# Patient Record
Sex: Female | Born: 1955 | Race: White | Hispanic: No | Marital: Married | Smoking: Former smoker
Health system: Southern US, Community
[De-identification: ages and names within clinical notes are randomized; demographics above are authoritative.]

## PROBLEM LIST (undated history)

## (undated) DIAGNOSIS — Z973 Presence of spectacles and contact lenses: Secondary | ICD-10-CM

## (undated) DIAGNOSIS — E119 Type 2 diabetes mellitus without complications: Secondary | ICD-10-CM

## (undated) DIAGNOSIS — E785 Hyperlipidemia, unspecified: Secondary | ICD-10-CM

## (undated) DIAGNOSIS — K219 Gastro-esophageal reflux disease without esophagitis: Secondary | ICD-10-CM

## (undated) DIAGNOSIS — E890 Postprocedural hypothyroidism: Secondary | ICD-10-CM

## (undated) DIAGNOSIS — F411 Generalized anxiety disorder: Secondary | ICD-10-CM

## (undated) DIAGNOSIS — Z8639 Personal history of other endocrine, nutritional and metabolic disease: Secondary | ICD-10-CM

## (undated) DIAGNOSIS — M75102 Unspecified rotator cuff tear or rupture of left shoulder, not specified as traumatic: Secondary | ICD-10-CM

## (undated) DIAGNOSIS — J209 Acute bronchitis, unspecified: Secondary | ICD-10-CM

## (undated) DIAGNOSIS — F32A Depression, unspecified: Secondary | ICD-10-CM

## (undated) DIAGNOSIS — M1990 Unspecified osteoarthritis, unspecified site: Secondary | ICD-10-CM

## (undated) DIAGNOSIS — I10 Essential (primary) hypertension: Secondary | ICD-10-CM

## (undated) DIAGNOSIS — F329 Major depressive disorder, single episode, unspecified: Secondary | ICD-10-CM

## (undated) HISTORY — DX: Hyperlipidemia, unspecified: E78.5

## (undated) HISTORY — DX: Unspecified osteoarthritis, unspecified site: M19.90

## (undated) HISTORY — PX: TOTAL HIP ARTHROPLASTY: SHX124

## (undated) HISTORY — PX: JOINT REPLACEMENT: SHX530

## (undated) HISTORY — DX: Essential (primary) hypertension: I10

---
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
## 1994-02-21 HISTORY — PX: TUBAL LIGATION: SHX77

## 1997-08-01 ENCOUNTER — Ambulatory Visit (HOSPITAL_COMMUNITY): Admission: RE | Admit: 1997-08-01 | Discharge: 1997-08-01 | Payer: Self-pay | Admitting: Obstetrics and Gynecology

## 1998-08-04 ENCOUNTER — Encounter: Payer: Self-pay | Admitting: Obstetrics and Gynecology

## 1998-08-04 ENCOUNTER — Ambulatory Visit (HOSPITAL_COMMUNITY): Admission: RE | Admit: 1998-08-04 | Discharge: 1998-08-04 | Payer: Self-pay | Admitting: Obstetrics and Gynecology

## 1998-08-12 ENCOUNTER — Other Ambulatory Visit: Admission: RE | Admit: 1998-08-12 | Discharge: 1998-08-12 | Payer: Self-pay | Admitting: Obstetrics and Gynecology

## 1999-08-06 ENCOUNTER — Encounter: Payer: Self-pay | Admitting: Internal Medicine

## 1999-08-06 ENCOUNTER — Ambulatory Visit (HOSPITAL_COMMUNITY): Admission: RE | Admit: 1999-08-06 | Discharge: 1999-08-06 | Payer: Self-pay | Admitting: Internal Medicine

## 1999-08-13 ENCOUNTER — Other Ambulatory Visit: Admission: RE | Admit: 1999-08-13 | Discharge: 1999-08-13 | Payer: Self-pay | Admitting: Obstetrics and Gynecology

## 2000-08-07 ENCOUNTER — Encounter: Payer: Self-pay | Admitting: Obstetrics and Gynecology

## 2000-08-07 ENCOUNTER — Ambulatory Visit (HOSPITAL_COMMUNITY): Admission: RE | Admit: 2000-08-07 | Discharge: 2000-08-07 | Payer: Self-pay | Admitting: Obstetrics and Gynecology

## 2000-08-22 ENCOUNTER — Other Ambulatory Visit: Admission: RE | Admit: 2000-08-22 | Discharge: 2000-08-22 | Payer: Self-pay | Admitting: Obstetrics and Gynecology

## 2000-12-22 ENCOUNTER — Encounter: Payer: Self-pay | Admitting: Orthopedic Surgery

## 2000-12-22 ENCOUNTER — Encounter: Admission: RE | Admit: 2000-12-22 | Discharge: 2000-12-22 | Payer: Self-pay | Admitting: Orthopedic Surgery

## 2001-06-21 ENCOUNTER — Encounter: Admission: RE | Admit: 2001-06-21 | Discharge: 2001-06-21 | Payer: Self-pay | Admitting: Orthopedic Surgery

## 2001-06-21 ENCOUNTER — Encounter: Payer: Self-pay | Admitting: Orthopedic Surgery

## 2001-08-09 ENCOUNTER — Ambulatory Visit (HOSPITAL_COMMUNITY): Admission: RE | Admit: 2001-08-09 | Discharge: 2001-08-09 | Payer: Self-pay | Admitting: Obstetrics and Gynecology

## 2001-08-09 ENCOUNTER — Encounter: Payer: Self-pay | Admitting: Obstetrics and Gynecology

## 2001-08-20 ENCOUNTER — Other Ambulatory Visit: Admission: RE | Admit: 2001-08-20 | Discharge: 2001-08-20 | Payer: Self-pay | Admitting: Obstetrics and Gynecology

## 2001-10-18 ENCOUNTER — Emergency Department (HOSPITAL_COMMUNITY): Admission: EM | Admit: 2001-10-18 | Discharge: 2001-10-18 | Payer: Self-pay | Admitting: Emergency Medicine

## 2002-08-23 ENCOUNTER — Ambulatory Visit (HOSPITAL_COMMUNITY): Admission: RE | Admit: 2002-08-23 | Discharge: 2002-08-23 | Payer: Self-pay | Admitting: Obstetrics and Gynecology

## 2002-08-23 ENCOUNTER — Encounter: Payer: Self-pay | Admitting: Obstetrics and Gynecology

## 2002-09-06 ENCOUNTER — Ambulatory Visit (HOSPITAL_BASED_OUTPATIENT_CLINIC_OR_DEPARTMENT_OTHER): Admission: RE | Admit: 2002-09-06 | Discharge: 2002-09-06 | Payer: Self-pay | Admitting: *Deleted

## 2003-01-08 ENCOUNTER — Other Ambulatory Visit: Admission: RE | Admit: 2003-01-08 | Discharge: 2003-01-08 | Payer: Self-pay | Admitting: Obstetrics and Gynecology

## 2003-06-16 ENCOUNTER — Inpatient Hospital Stay (HOSPITAL_COMMUNITY): Admission: RE | Admit: 2003-06-16 | Discharge: 2003-06-20 | Payer: Self-pay | Admitting: Orthopedic Surgery

## 2003-09-01 ENCOUNTER — Ambulatory Visit (HOSPITAL_COMMUNITY): Admission: RE | Admit: 2003-09-01 | Discharge: 2003-09-01 | Payer: Self-pay | Admitting: Obstetrics and Gynecology

## 2004-04-23 ENCOUNTER — Other Ambulatory Visit: Admission: RE | Admit: 2004-04-23 | Discharge: 2004-04-23 | Payer: Self-pay | Admitting: Obstetrics and Gynecology

## 2004-10-07 ENCOUNTER — Ambulatory Visit (HOSPITAL_COMMUNITY): Admission: RE | Admit: 2004-10-07 | Discharge: 2004-10-07 | Payer: Self-pay | Admitting: Obstetrics and Gynecology

## 2005-02-21 HISTORY — PX: THYROIDECTOMY: SHX17

## 2005-06-09 ENCOUNTER — Other Ambulatory Visit: Admission: RE | Admit: 2005-06-09 | Discharge: 2005-06-09 | Payer: Self-pay | Admitting: Interventional Radiology

## 2005-06-09 ENCOUNTER — Encounter: Admission: RE | Admit: 2005-06-09 | Discharge: 2005-06-09 | Payer: Self-pay | Admitting: General Surgery

## 2005-06-09 ENCOUNTER — Encounter (INDEPENDENT_AMBULATORY_CARE_PROVIDER_SITE_OTHER): Payer: Self-pay | Admitting: Specialist

## 2005-08-19 ENCOUNTER — Ambulatory Visit (HOSPITAL_COMMUNITY): Admission: RE | Admit: 2005-08-19 | Discharge: 2005-08-20 | Payer: Self-pay | Admitting: General Surgery

## 2005-08-19 ENCOUNTER — Encounter (INDEPENDENT_AMBULATORY_CARE_PROVIDER_SITE_OTHER): Payer: Self-pay | Admitting: Specialist

## 2005-10-26 ENCOUNTER — Ambulatory Visit (HOSPITAL_COMMUNITY): Admission: RE | Admit: 2005-10-26 | Discharge: 2005-10-26 | Payer: Self-pay | Admitting: Obstetrics and Gynecology

## 2005-11-28 ENCOUNTER — Ambulatory Visit (HOSPITAL_COMMUNITY): Admission: RE | Admit: 2005-11-28 | Discharge: 2005-11-28 | Payer: Self-pay | Admitting: Occupational Medicine

## 2006-01-13 ENCOUNTER — Encounter: Admission: RE | Admit: 2006-01-13 | Discharge: 2006-01-13 | Payer: Self-pay | Admitting: Orthopedic Surgery

## 2006-08-21 ENCOUNTER — Inpatient Hospital Stay (HOSPITAL_COMMUNITY): Admission: RE | Admit: 2006-08-21 | Discharge: 2006-08-24 | Payer: Self-pay | Admitting: Orthopedic Surgery

## 2006-09-21 ENCOUNTER — Ambulatory Visit (HOSPITAL_BASED_OUTPATIENT_CLINIC_OR_DEPARTMENT_OTHER): Admission: RE | Admit: 2006-09-21 | Discharge: 2006-09-21 | Payer: Self-pay | Admitting: Specialist

## 2006-09-21 HISTORY — PX: OTHER SURGICAL HISTORY: SHX169

## 2006-10-03 ENCOUNTER — Encounter: Admission: RE | Admit: 2006-10-03 | Discharge: 2007-01-01 | Payer: Self-pay | Admitting: Specialist

## 2007-01-10 ENCOUNTER — Encounter: Admission: RE | Admit: 2007-01-10 | Discharge: 2007-01-17 | Payer: Self-pay | Admitting: Specialist

## 2007-03-28 ENCOUNTER — Ambulatory Visit (HOSPITAL_COMMUNITY): Admission: RE | Admit: 2007-03-28 | Discharge: 2007-03-28 | Payer: Self-pay | Admitting: Obstetrics and Gynecology

## 2007-09-05 ENCOUNTER — Encounter: Admission: RE | Admit: 2007-09-05 | Discharge: 2007-11-13 | Payer: Self-pay | Admitting: Endocrinology

## 2007-10-16 ENCOUNTER — Ambulatory Visit (HOSPITAL_COMMUNITY): Admission: RE | Admit: 2007-10-16 | Discharge: 2007-10-16 | Payer: Self-pay | Admitting: Gastroenterology

## 2007-10-16 ENCOUNTER — Encounter (INDEPENDENT_AMBULATORY_CARE_PROVIDER_SITE_OTHER): Payer: Self-pay | Admitting: Gastroenterology

## 2008-05-05 ENCOUNTER — Ambulatory Visit (HOSPITAL_COMMUNITY): Admission: RE | Admit: 2008-05-05 | Discharge: 2008-05-05 | Payer: Self-pay | Admitting: Obstetrics and Gynecology

## 2008-06-11 ENCOUNTER — Ambulatory Visit (HOSPITAL_COMMUNITY): Admission: RE | Admit: 2008-06-11 | Discharge: 2008-06-11 | Payer: Self-pay | Admitting: Obstetrics and Gynecology

## 2008-06-16 ENCOUNTER — Ambulatory Visit (HOSPITAL_COMMUNITY): Admission: RE | Admit: 2008-06-16 | Discharge: 2008-06-16 | Payer: Self-pay | Admitting: Family Medicine

## 2009-07-02 ENCOUNTER — Ambulatory Visit (HOSPITAL_COMMUNITY): Admission: RE | Admit: 2009-07-02 | Discharge: 2009-07-02 | Payer: Self-pay | Admitting: Obstetrics and Gynecology

## 2009-08-14 ENCOUNTER — Emergency Department (HOSPITAL_COMMUNITY): Admission: EM | Admit: 2009-08-14 | Discharge: 2009-08-14 | Payer: Self-pay | Admitting: Emergency Medicine

## 2009-10-21 ENCOUNTER — Ambulatory Visit (HOSPITAL_COMMUNITY)
Admission: RE | Admit: 2009-10-21 | Discharge: 2009-10-21 | Payer: Self-pay | Source: Home / Self Care | Admitting: Internal Medicine

## 2010-03-14 ENCOUNTER — Encounter: Payer: Self-pay | Admitting: Family Medicine

## 2010-04-27 IMAGING — MG MM DIGITAL SCREENING BILAT
4 series · 4 of 4 positions shown · non-contrast
Comparison: Prior studies.

DG SCREEN MAMMOGRAM BILATERAL
Bilateral CC and MLO view(s) were taken.
Technologist: Nehman Othman, RT, RM

DIGITAL SCREENING MAMMOGRAM WITH CAD:

[R CC]
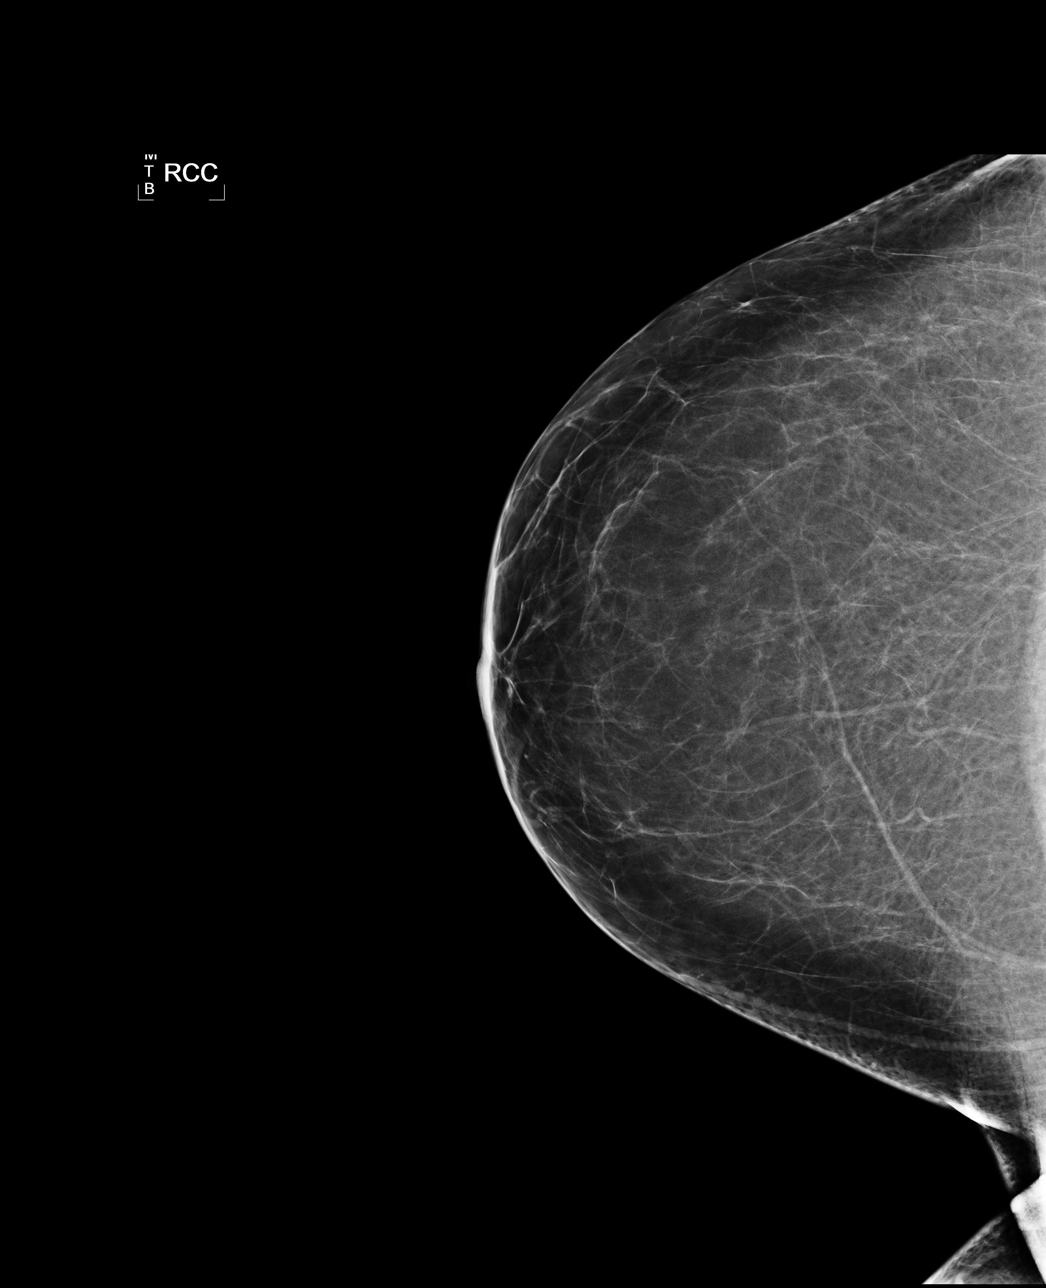

[R MLO]
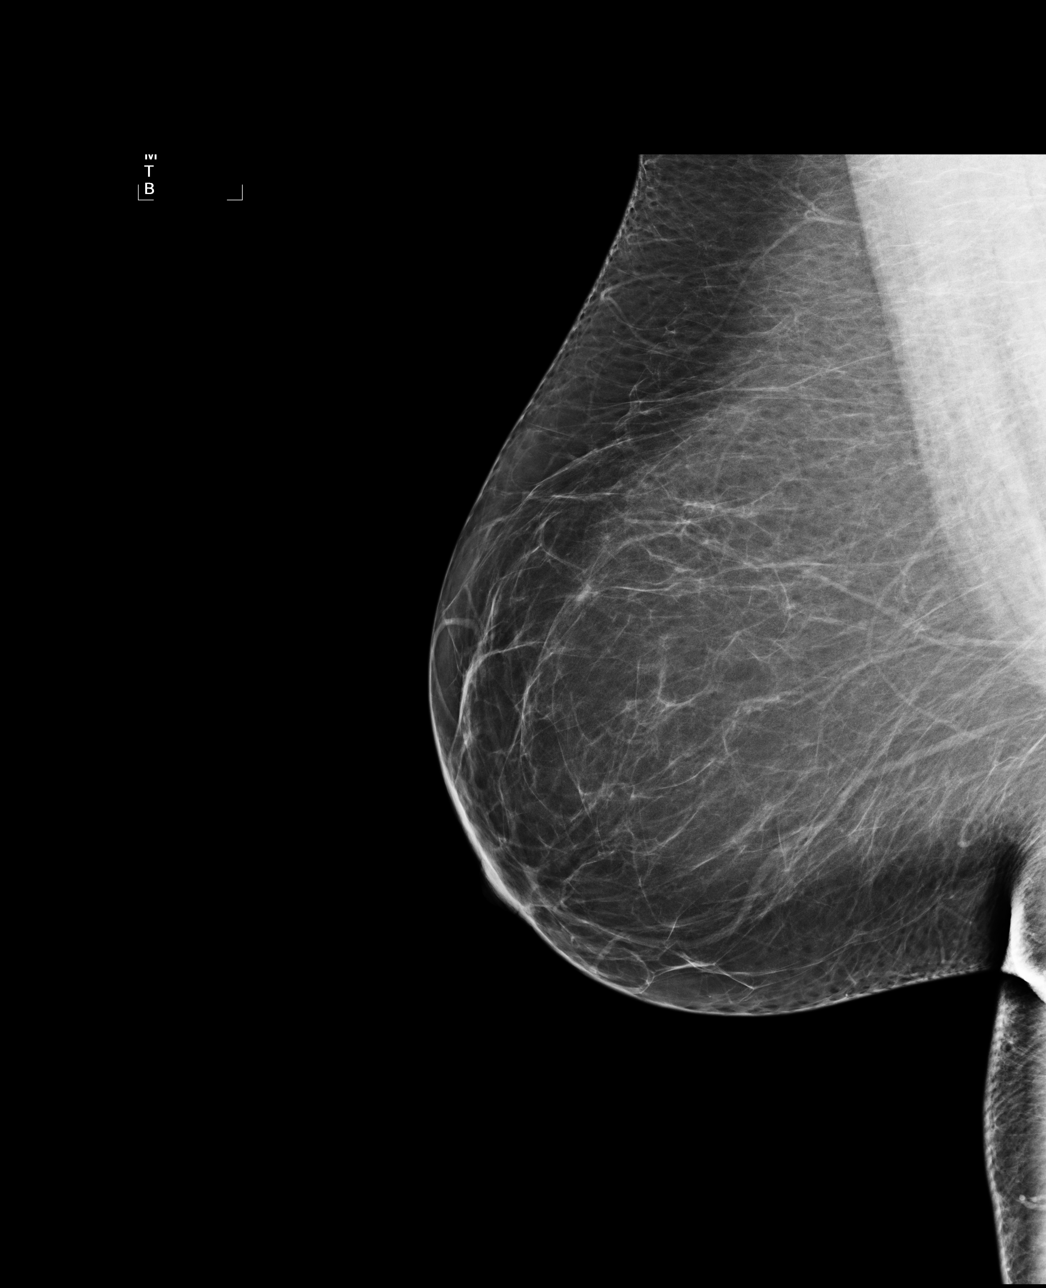

[L CC]
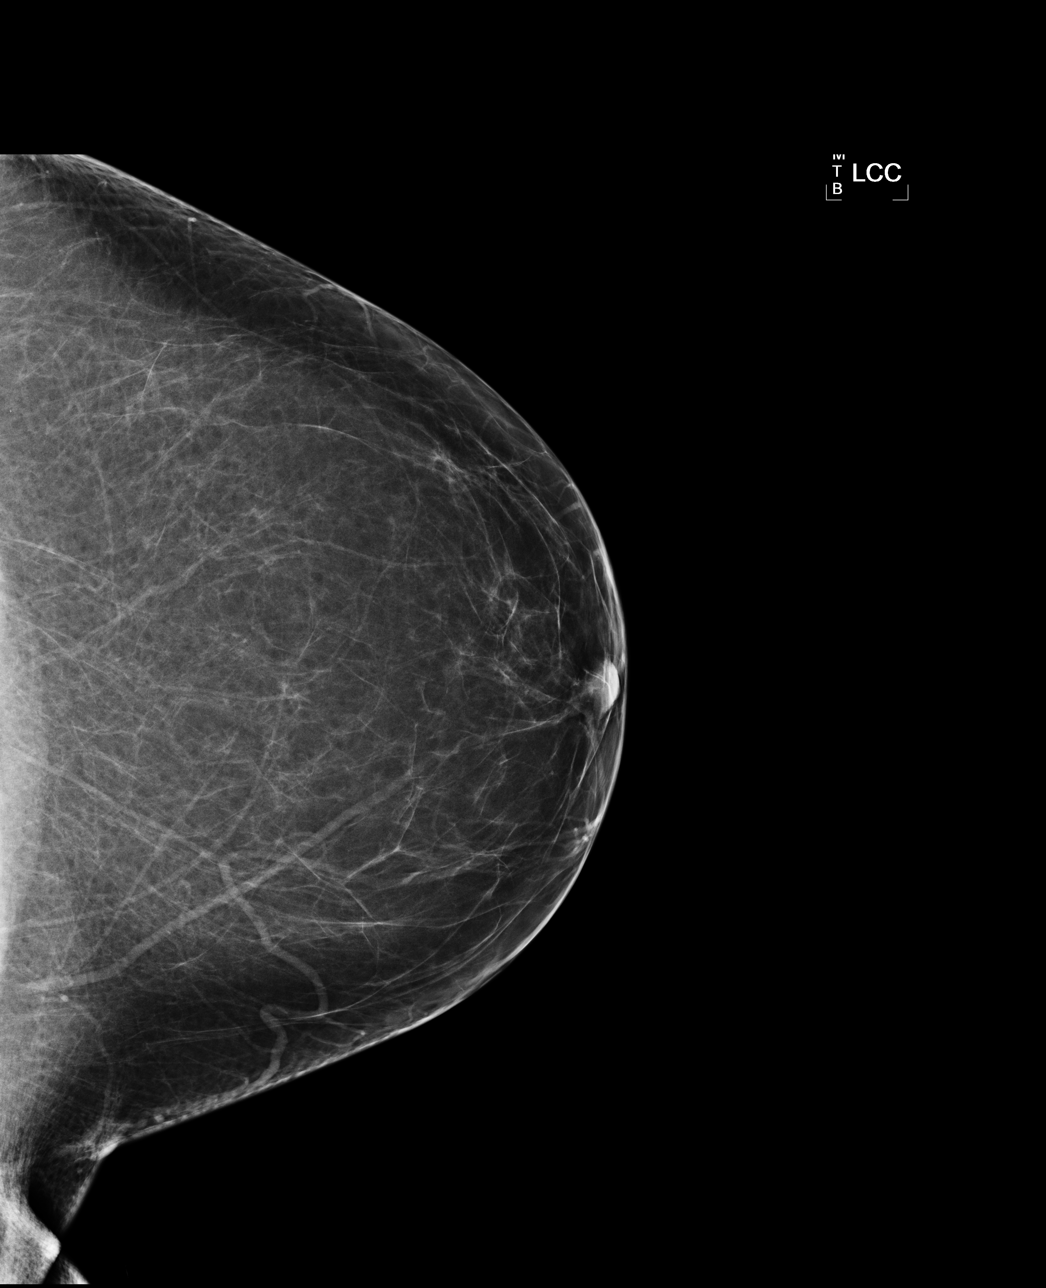

[L MLO]
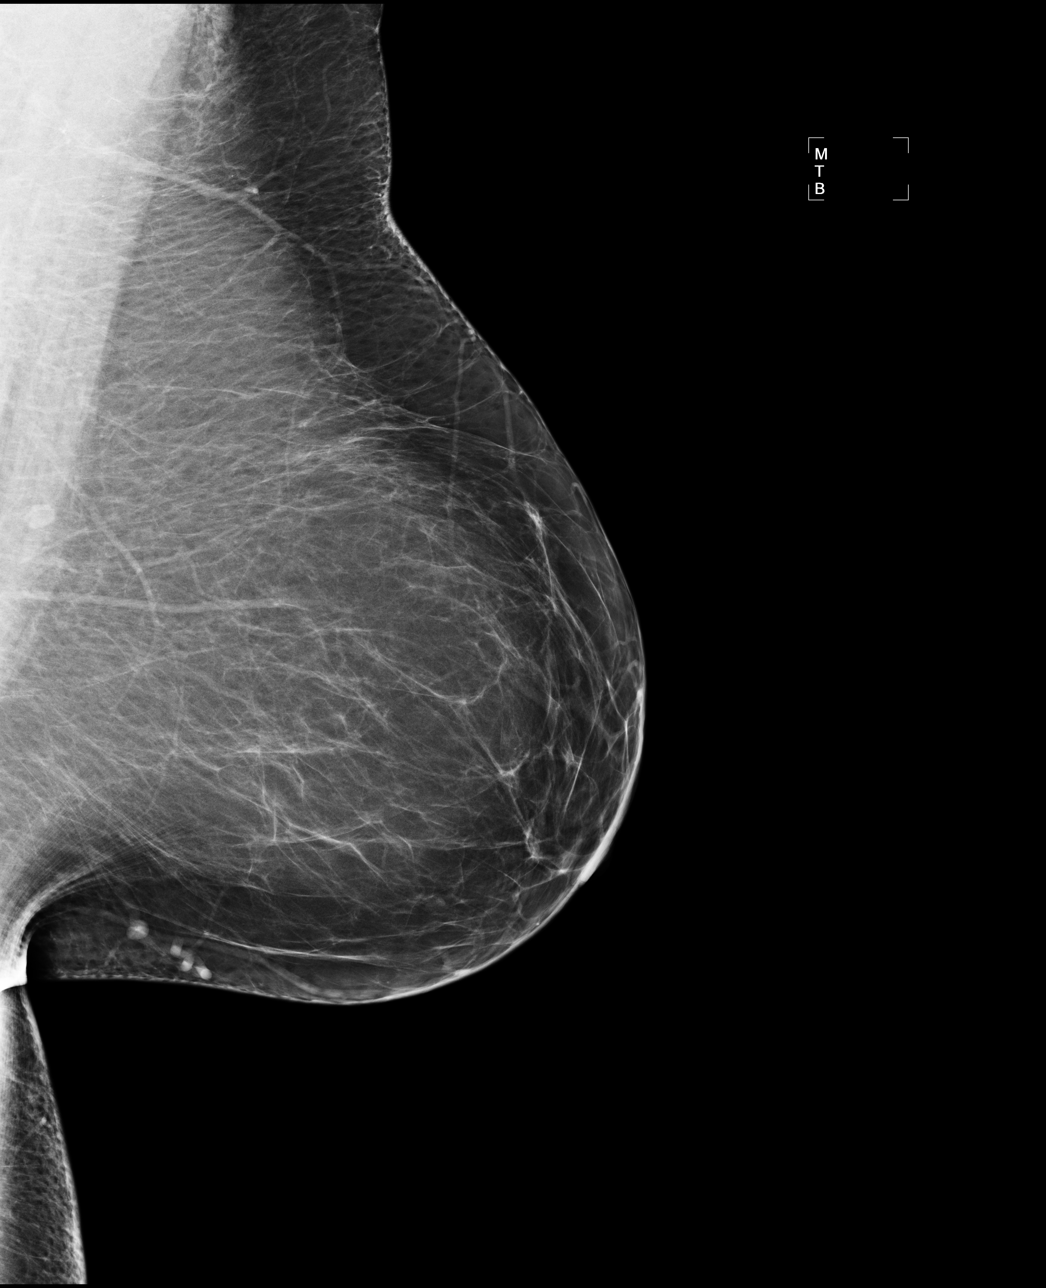

[4 of 4 positions shown; findings below may reference images not displayed]

The breast tissue is almost entirely fatty.  There is no dominant mass, architectural distortion or
calcification to suggest malignancy.
IMPRESSION: No mammographic evidence of malignancy.  Suggest yearly screening mammography.

A result letter of this screening mammogram will be mailed directly to the patient.

ASSESSMENT: Negative - BI-RADS 1

Screening mammogram in 1 year.
ANALYZED BY COMPUTER AIDED DETECTION. , THIS PROCEDURE WAS A DIGITAL MAMMOGRAM.

## 2010-07-06 NOTE — Op Note (Signed)
NAMERollene Howell              ACCOUNT NO.:  [id number]   MEDICAL RECORD NO.:  [id number]          PATIENT TYPE:  INP   LOCATION:  F621                         FACILITY:  [facility]   PHYSICIAN:  Ollen Gross, M.D.    DATE OF BIRTH:  1955-10-23   DATE OF PROCEDURE:  08/21/2006  DATE OF DISCHARGE:                               OPERATIVE REPORT   PREOPERATIVE DIAGNOSIS:  Osteoarthritis and dysplasia right hip.   POSTOPERATIVE DIAGNOSIS:  Osteoarthritis and dysplasia right hip.   PROCEDURE:  Right total hip arthroplasty.   SURGEON:  Ollen Gross, M.D.   ASSISTANT:  Avel Peace, PA-C.   ANESTHESIA:  General.   ESTIMATED BLOOD LOSS:  600.   DRAINS:  None.   COMPLICATIONS:  None.   CONDITION:  Stable to recovery.   BRIEF CLINICAL NOTE:  Cathy Howell is a 55 year old female who had a  successful left total hip arthroplasty done in April 2005.  She has  progressively worsening right hip pain and dysfunction with bone-on-bone  changes.  Presents now for right total hip arthroplasty.   PROCEDURE IN DETAIL:  After the successful administration of general  anesthetic, the patient is placed in the left lateral decubitus position  with the right side up and held with the hip positioner.  Right lower  extremity was isolated from her perineum with plastic drapes and prepped  and draped in the usual sterile fashion.  Short posterior and lateral  incisions made with a 10 blade through the subcutaneous tissue to the  level of the fascia lata was is incised in line with the skin incision.  Sciatic nerve is palpated and protected and short rotator is isolated  off the femur.  Capsulectomy is performed and the hip is dislocated.  Center of femoral head is marked and a trial prosthesis placed such that  the center of the trial head corresponds to the center of her native  femoral head.  Osteotomy lines marked on the femoral neck and osteotomy  made with an oscillating saw.  Femoral head is  removed and the femur  retracted anteriorly to gain acetabular exposure.   Acetabular retractors are placed.  She had a large osteophytes anterior-  posterior which are removed.  Reaming starts at 43 mm coursing  increments of 2-49 mm and a 50 mm pinnacle acetabular shell is placed in  anatomic position with excellent purchase and transfixed with two dome  screws.  The apex hole eliminator is placed and then the 36 mm neutral  Ultamet metal liner is placed.   Femur is repaired with the canal finder and irrigation.  Axial reaming  is performed up to 11.5 mm, proximal reaming to a 94F and the sleeve  machined to a small.  A 94F small trial sleeve is placed with a 16 x 11  stem and a 36 +6 neck about 10 degrees beyond her native anteversion.  A  36 +0 head is placed and the hip is reduced with outstanding stability.  There is full extension, full external rotation, 70 degrees flexion, 40  degrees adduction, 90 degrees internal rotation and 90 degrees  of  flexion and 70 degrees of internal rotation.  By placing the right leg  on top of the left, I felt as though the leg lengths are equal.   The hip is dislocated and trials removed.  Permanent 76F small sleeve is  placed with a 16 x 11 stem and 36  +6 neck 10 degrees beyond native  anteversion.  The 36 +0 head is placed.  The hip is reduced in the same  stability parameters.  Wound is copiously irrigated with saline solution  and short rotator is reattached to the femur through drill holes.  Fascia lata is closed with interrupted #1 Vicryl, subcu closed with #1  and 2-0 Vicryl, subcuticular with running 4-0 Monocryl.  The incision is  cleaned and dried and Steri-Strips and a bulky sterile dressing applied.  She is then awakened and transported to recovery in stable condition.      Ollen Gross, M.D.  Electronically Signed     FA/MEDQ  D:  08/21/2006  T:  08/21/2006  Job:  [id number]

## 2010-07-06 NOTE — Op Note (Signed)
NAMERollene Howell              ACCOUNT NO.:  [id number]   MEDICAL RECORD NO.:  [id number]          PATIENT TYPE:  AMB   LOCATION:  DSC                          FACILITY:  [facility]   PHYSICIAN:  Erasmo Leventhal, M.D.DATE OF BIRTH:  14-Oct-1955   DATE OF PROCEDURE:  09/21/2006  DATE OF DISCHARGE:                               OPERATIVE REPORT   PREOPERATIVE DIAGNOSES:  Left shoulder impingement, rotator cuff tear,  acromioclavicular arthritis.   POSTOPERATIVE DIAGNOSES:  Left shoulder impingement, rotator cuff tear,  acromioclavicular arthritis.   PROCEDURE:  Left shoulder examination under anesthesia, arthroscopic  subacromial decompression, acromioplasty, bursectomy, CA ligament  release, arthroscopic disc clavicle resection Mumford procedure,  arthroscopic rotator cuff repair.   SURGEON:  Erasmo Leventhal, MD   ASSISTANT:  Vertell Novak, PA-C   ANESTHESIA:  Interscalene block, general.   ESTIMATED BLOOD LOSS:  Less than 10 mL.   DRAINS:  None.   COMPLICATIONS:  None.   DISPOSITION:  PACU, stable.   OPERATIVE DETAILS:  The patient and family was counseled in the holding  area.  Correct site was identified.  IV started.  Antibiotics given.  Block was administered.  Cathy Howell was then taken to the operating room,  placed in supine position under general anesthesia.  Cathy Howell had undergone a  right total hip arthroplasty relatively recently.  Cathy Howell was cleared by  her hip surgeon to have arthroscopic surgery on her shoulder.  Cathy Howell was  gently turned to the right lateral decubitus position, properly padded,  and bumped.  Great care was given to the right lower extremity.  I made  sure it did not bother her wound nor her hip replacement.  Left shoulder  was examined and Cathy Howell was found to be full range of motion and stable.  Cathy Howell was appropriately prepped and draped in a sterile fashion.  Overhead  shoulder position utilized at 30-degrees abduction, 10-degrees full  flexion with  10 pounds of longitudinal traction. Posterior portal was  created and arthroscope was placed in the glenohumeral joint.  Diagnostic arthroscopy revealed normal articular cartilage, labrum,  biceps, glenohumeral ligament, synovium, and capsule.  Cathy Howell had a small  rent in the leading edge of the supraspinatus without any significant  retraction.  Arthroscope was placed in the subacromial region where a  thick subacromial bursa was encountered.  Lateral port was established  and subacromial bursectomy was performed.  Cathy Howell was found to have a 1.5-  cm rotator cuff tear at the leading edge of the supraspinatus without  significant retraction.  It was gently mobilized at rotator cuff repair  site.  The rotator cuff footprint was repaired appropriately.  The  ArthroCare system was utilized to release the periosteum of the CA  ligament.  Bur was then placed posteriorly and an anterior inferior  acromioplasty was performed converting to a flat acromion morphology.  AC joint was found to be remarkably osteoarthritic with underlying  subacromial subclavicular spur.  Accessory anterior portal was made, bur  was then placed, and a lateral 5-8 mm of the clavicle was removed  circumferentially leaving a superior and posterior acromioclavicular  limbus and capsule intact.  Arthroscopic debris was removed.  Hemostasis  obtained.  The clavicle was palpated and found to be stable.  I did do a  different portal site to a different small puncture area and an Arthrex  Bioabsorbable anchor was placed at deadman's angle.  It was  appropriately locked into placed.  Utilizing arthroscopic technique  mattress sutures were placed and tied down sequentially.  These were  then placed into a PushLock anchor for a double row technique.  We now  had a well-mobilized repaired rotator cuff and had reconstructed the  musculotendinous insertion site and reestablished the anterior anchor.  It was irrigated and arthroscopic  debris was removed and taken out of  traction.  Normal pulses in the wrist at the end of the case.  The  portals closed with nylon suture.  Another 10 mL of 0.25% Marcaine with  epinephrine was placed in the portal sites at subacromial regions.  After confirming the anesthesia, sterile dressings applied.  Cathy Howell was  returned supine and placed in a small shoulder sling and awakened,  extubated, and taken to operating room PACU in stable condition.  The  patient tolerated the procedure well.  There were no complications or  problems.  We also checked her hip and lower extremities and they were  fine at the end of the case.  The patient left the operating room in  stable condition.  Sponge and needle count were correct.   Help with surgical technique: Clover Mealy, PA-C, assistance was  needed throughout the entire case.           ______________________________  Erasmo Leventhal, M.D.     RAC/MEDQ  D:  09/21/2006  T:  09/21/2006  Job:  [id number]

## 2010-07-06 NOTE — Op Note (Signed)
NAMERollene Howell              ACCOUNT NO.:  [id number]   MEDICAL RECORD NO.:  [id number]          PATIENT TYPE:  AMB   LOCATION:  ENDO                         FACILITY:  [facility]   PHYSICIAN:  Petra Kuba, M.D.    DATE OF BIRTH:  [birth year]   DATE OF PROCEDURE:  10/16/2007  DATE OF DISCHARGE:                               OPERATIVE REPORT   PROCEDURE:  Colonoscopy with biopsy.   INDICATIONS:  Patient with mom with questionable polyps. Her dad, i.e.  her grandfather, with colon cancer. Due for colonic screening. Consent  was signed after risks, benefits, methods, and options thoroughly  discussed prior to any sedation in my office with my nurse.   MEDICINES USED:  Fentanyl 100 mcg, Versed 8 mg.   PROCEDURE NOTE:  Rectal inspection was pertinent for external  hemorrhoids, small. Digital exam was negative. The video colonoscope was  inserted and with abdominal pressure easily advanced around the colon to  the cecum. On insertion, some left-sided diverticula were seen but no  other abnormalities. The cecum was identified by the appendiceal orifice  and ileocecal valve. Prep was adequate. There was some liquid stool that  required washing and suctioning. On slow withdrawal through the colon,  the cecum, ascending and majority of the transverse was normal. Near the  proximal level of the splenic flexure, a tiny polyp was seen and cold  biopsied x4. The scope was slowly withdrawn. The left-sided diverticula  were confirmed. Once back in the rectum, anorectal pull through and  retroflexion confirmed some small hemorrhoids. The scope was  straightened and readvanced a short way up the left side of the colon.  Air was suctioned. Scope removed. The patient tolerated the procedure  well. There were no obvious immediate complications.   ENDOSCOPIC DIAGNOSES:  1. Internal and external small hemorrhoids.  2. Left-sided diverticula.  3. Proximal level splenic flexure tiny poly, cold  biopsied.  4. Otherwise within normal limits to the cecum.   PLAN:  Await pathology. Probably repeat colon screening in 5 years.  Happy to see back p.r.n.           ______________________________  Petra Kuba, M.D.     MEM/MEDQ  D:  10/16/2007  T:  10/16/2007  Job:  [id number]   cc:   Petra Kuba, M.D.  Fax: [phone]   Dorisann Frames, M.D.

## 2010-07-06 NOTE — Discharge Summary (Signed)
NAMERollene Howell              ACCOUNT NO.:  [id number]   MEDICAL RECORD NO.:  [id number]          PATIENT TYPE:  INP   LOCATION:  1616                         FACILITY:  [facility]   PHYSICIAN:  Ollen Gross, M.D.    DATE OF BIRTH:  [birth year]   DATE OF ADMISSION:  08/21/2006  DATE OF DISCHARGE:  08/24/2006                               DISCHARGE SUMMARY   ADMISSION DIAGNOSES:  1. Dysplasia with end-stage osteoarthritis, right hip.  2. Hypertension.  3. Hypercholesterolemia.  4. 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  5. History of urinary tract infection.  6. Hypothyroidism.  7. Osteoporosis.  8. Postmenopausal.   DISCHARGE DIAGNOSES:  1. Osteoarthritis, right hip, with dysplasia, status post right total      hip arthroplasty.  2. Postoperative blood loss anemia.  3. Status post transfusion without sequelae.  4. Hypertension.  5. Hypercholesterolemia.  6. History of toxoplasmosis, age 47.  7. History of urinary tract infection.  8. Hypothyroidism.  9. Osteoporosis.  10.Postmenopausal.   PROCEDURE:  August 21, 2006, right total hip.  Surgeon:  Dr. Lequita Halt.  Assistant:  Avel Peace, PA-C.  Anesthesia:  General.   CONSULTATIONS:  None.   BRIEF HISTORY:  Cathy Howell is a 55 year old female with successful left  total hip done in April 2005, progressive worsening right hip pain and  dysfunction bone-on-bone, felt to be a good candidate for right total  hip.   LABORATORY DATA:  Admission CBC shows hemoglobin of 12.3, hematocrit of  35.2, white cell count of 6.2.  Postop hemoglobin of 9.0, drifted down  to 8.5, given blood.  Post-transfusion hemoglobin back up to 9.1.  PT/PTT on admission 13.4 and 28, respectively.  INR 1.0.  Serial pro  times followed, last noted INR 2.2.  Chem panel on admission all within  normal limits.  Serial BMETs were followed.  Electrolytes remained  within normal limits.  Preop UA negative.  Blood group type O+.   X-RAYS:  Two-view chest August 16, 2006:   Mild elevated right  hemidiaphragm without change.  No active disease.  Right hip film:  Status post left total hip, moderately advanced degenerative, right hip.   EKG August 16, 2006:  Normal sinus rhythm, early RS transition in V2.  No  significant change was found when compared to August 16, 2006.  Confirmed  by Dr. Eldridge Dace.   HOSPITAL COURSE:  The patient was admitted to [facility],  tolerated the procedure well.  Later transferred to the recovery  room  and orthopedic floor.  Started on PCA and p.o. analgesic pain control  following surgery.  Doing fairly well on the morning of day #1.  She was  doing better than with her previous hip.  She had moderate pain postop.  She was tolerating p.o.'s and using the PCA.  Hemoglobin was down little  bit so we started er on some iron.  She started getting out of bed.  Unfortunately, on the evening of day #1, morning of day #2, she had a  moderate increase in her pain requiring reinitiation of the PCA, which  had been discontinued on  day #1, and the covering service switched her  over from the Percocet over to the Dilaudid.  Her hemoglobin was down to  8.5 so we gave her one 1 unit of blood.  She had a little elevated  temperature so we encouraged incentive spirometer.  A little slow going  on day #2 on the morning; however, after she got her blood, she was  feeling a little bit better.  She did get up and walk about 200 feet  that evening.  We also started her on OxyContin, which helped out with  the pain control.  By day #3 on the morning we noticed that she had been  switched back over from the Dilaudid back over to Percocet.  She was  doing a little bit better with her of pain control.  Dressing changed on  day #2 and also again on day #3.  Incision was healing well.  She was  seen in rounds on the morning of day #3 by Dr. Lequita Halt.  They discussed  her therapy, her pain control, which was under better control, and she  wanted to go  home later that day.   DISCHARGE PLAN:  1. The patient was discharged home on August 24, 2006.  2. Discharge diagnoses:  Same as above.  3. Discharge medications:  Percocet, OxyContin, Robaxin, Coumadin, Nu-      Iron.  4. Diet:  Resume previous home diet.  5. Activity:  She is partial weightbearing, 25-50% in the right lower      extremity.  Hip precautions, total hip protocol.  Home health PT      and home health nursing.  6. Follow-up:  Two weeks from discharge.  Please contact the office at      [phone].   DISPOSITION:  Home.   CONDITION ON DISCHARGE:  Improving.      Alexzandrew L. Perkins, P.A.C.      Ollen Gross, M.D.  Electronically Signed    ALP/MEDQ  D:  08/24/2006  T:  08/24/2006  Job:  [id number]   cc:   Cathy Howell, M.D.  Fax: [phone]

## 2010-07-06 NOTE — H&P (Signed)
NAMERollene Howell              ACCOUNT NO.:  [id number]   MEDICAL RECORD NO.:  [id number]          PATIENT TYPE:  INP   LOCATION:  NA                           FACILITY:  [facility]   PHYSICIAN:  Ollen Gross, M.D.    DATE OF BIRTH:  August 04, 1955   DATE OF ADMISSION:  08/21/2006  DATE OF DISCHARGE:                              HISTORY & PHYSICAL   CHIEF COMPLAINT:  Right hip pain.   HISTORY OF PRESENT ILLNESS:  The patient is a 55 year old female who has  had ongoing right hip pain for quite some time now.  She has undergone a  left total hip back in 2005 and has done very well with that.  The right  hip has continued to be progressive in nature, and it is felt she has  reached a point where she could benefit undergoing the other side.  Risks and benefits discussed.  The patient was subsequently admitted to  the hospital.   ALLERGIES:  NO KNOWN DRUG ALLERGIES.   CURRENT MEDICATIONS:  1. Lisinopril/hydrochlorothiazide.  2. Levothyroxine.  3. Temazepam.  4. Trazodone.  5. Pravastatin.  6. Propoxyphene (Darvocet).   PAST MEDICAL HISTORY:  1. Hypertension.  2. Past history of toxoplasmosis back around age 12.  3. History of urinary tract infections.  4. Hypercholesterolemia.  5. Hypothyroidism.  6. Osteoporosis,.  7. Postmenopausal.   PAST SURGICAL HISTORY:  1. Left total hip replacement April 2005.  2. Thyroidectomy June 2007.   SOCIAL HISTORY:  Married.  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  [employer].  No alcohol.  Two children.  Husband, son and  friends will be assisting with care after surgery.   FAMILY HISTORY:  Mother with a history of diabetes.  Grandmother on  mother's side with history of stroke.   FAMILY HISTORY:  History of hypertension.   REVIEW OF SYSTEMS:  GENERAL:  No fevers, chills, night sweats.  NEUROLOGICAL:  No seizures or paralysis.  RESPIRATORY: No shortness of  breath, productive cough or hemoptysis.  CARDIOVASCULAR:  No Chest  pain  or orthopnea.  GI: No nausea, vomiting, diarrhea or constipation.  GU:  No dysuria, hematuria or discharge.  MUSCULOSKELETAL:  Right hip.   PHYSICAL EXAMINATION:  VITAL SIGNS:  Pulse 74, respirations 12, blood  pressure 110/68.  GENERAL: A 55 year old white female well-nourished, well-developed,  short stature, slightly overweight.  No acute distress, alert and  cooperative, pleasant.  HEENT: Normal normocephalic, atraumatic.  Pupils round and reactive.  No  glasses.  EOMs intact.  NECK:  Supple.  CHEST:  Clear.  HEART:  Regular rate and rhythm without murmur.  ABDOMEN:  Soft, slightly round.  Bowel sounds present.  RECTAL/BREASTS/GU:  Not done, not pertinent to present illness.  EXTREMITIES:  Right hip:  Right hip shows flexion of 100 degrees, zero  internal rotation, zero external rotation by 20 degrees abduction.   IMPRESSION:  1. Dysplasia with end-stage osteoarthritis right hip.  2. Hypertension.  3. Hypercholesterolemia.  4. History of toxoplasmosis age 23.  5. History of urinary tract infections.  6. Hypothyroidism.  7. Osteoporosis.  8. Postmenopausal.   PLAN:  The patient was admitted to [facility] to undergo a  right total hip replacement arthroplasty.  Surgery will be performed by  Dr. Ollen Gross.      Alexzandrew L. Perkins, P.A.C.      Ollen Gross, M.D.  Electronically Signed    ALP/MEDQ  D:  08/20/2006  T:  08/21/2006  Job:  [id number]   cc:   Teena Irani. Arlyce Dice, M.D.  Fax: [phone]   Ollen Gross, M.D.  Fax: [phone]

## 2010-07-09 NOTE — Consult Note (Signed)
NAMERollene Howell                          ACCOUNT NO.:  [id number]   MEDICAL RECORD NO.:  [id number]                   PATIENT TYPE:  INP   LOCATION:  0481                                 FACILITY:  [facility]   PHYSICIAN:  Melissa L. Ladona Ridgel, MD               DATE OF BIRTH:  Jul 22, 1955   DATE OF CONSULTATION:  06/18/2003  DATE OF DISCHARGE:                                   CONSULTATION   REASON FOR CONSULTATION:  Hypotension and hyperglycemia.   HISTORY OF PRESENT ILLNESS:  This is a 55 year old white female with past  medical history for severe left hip arthritis.  She is status post left hip  replacement.  Today she was noted to be postoperatively hypotensive and  hyperglycemia.  Blood pressure medications were held.   PAST MEDICAL HISTORY:  1. Hypertension.  2. Toxoplasmosis infection.   PAST SURGICAL HISTORY:  Tubal ligation and tonsils removed, I&D of leg  related to a spider bite.   SOCIAL HISTORY:  She does not smoke, does not drink.   FAMILY HISTORY:  Her Mom is living with hypertension and arthritis.  Dad is  deceased related to an unknown disease.   ALLERGIES:  None known.   HOME MEDICATIONS:  1. Vicon.  2. Lisinopril/hydrochlorothiazide 20/12.5 mg daily.   REVIEW OF SYSTEMS:  Positive hip pain.  Negative dizziness, negative chest  pain, negative shortness of breath, negative for dysuria.  Positive  menstrual cycle which started yesterday.   PHYSICAL EXAMINATION:  VITAL SIGNS:  Her temperature is 99.7 at present,  however her T-max was in the 100.1 range, pulse 119, blood pressure 86/42,  respirations are 18, saturation is 100% on 2 liters.  GENERAL:  She is in no acute distress at this time, however earlier she was  in mild to moderate distress secondary to pain.  HEENT:  She is normocephalic, atraumatic.  Pupils are equal, round and  reactive to light.  Extraocular movements are intact.  Mucous membranes are  moist.  NECK:  Supple, there is no JVD, no  lymph nodes.  CHEST:  Clear to auscultation, no rhonchi, rales or wheezes.  CARDIOVASCULAR:  Tachycardic with positive S1, S2, no S3 or S4.  ABDOMEN:  Soft, nontender, nondistended with positive bowel sounds.  EXTREMITIES:  Are warm with 2+ pulses and no edema.  NEUROLOGIC:  The patient is awake, alert and oriented x3.  Cranial nerves II-  XII  are intact.   LABORATORY DATA:  Reveal a hemoglobin of 9.1, hematocrit 26.3, which is a  decrease from her preadmission hematocrit of 36.  Her last basic metabolic  panel was on June 10, 2003 and was within normal limits.   ASSESSMENT AND PLAN:  This is a 55 year old white female status post left  hip replacement who was found to be hypotensive and hyperglycemic with some  tachycardia.   1. Hyperglycemia is likely related to stress and to D5  in her IV fluids.     Will change this to normal saline and follow her values.  2. Hypotension.  The patient is anemic and volume contracted, we will give     her normal saline at 100cc/Hr. and replete her with packed red blood     cells x2 and check a TSH.  3. Cardiovascular.  Holding her blood pressure medications at this time.     She has sinus tachycardia on her EKG with some new changes that are     likely related to lead placement and tachycardia but we will repeat an     EKG.  For now we will control her pain, replete her packed red blood     cells, and give her a fluid bolus.   We will follow along.  Thank you for this consultation.                                               Melissa L. Ladona Ridgel, MD    MLT/MEDQ  D:  06/18/2003  T:  06/18/2003  Job:  [id number]

## 2010-07-14 ENCOUNTER — Other Ambulatory Visit (HOSPITAL_COMMUNITY): Payer: Self-pay | Admitting: Obstetrics and Gynecology

## 2010-07-14 DIAGNOSIS — Z1231 Encounter for screening mammogram for malignant neoplasm of breast: Secondary | ICD-10-CM

## 2010-07-28 ENCOUNTER — Ambulatory Visit (HOSPITAL_COMMUNITY): Payer: Self-pay

## 2010-09-06 ENCOUNTER — Encounter (INDEPENDENT_AMBULATORY_CARE_PROVIDER_SITE_OTHER): Payer: 59

## 2010-09-06 DIAGNOSIS — M79609 Pain in unspecified limb: Secondary | ICD-10-CM

## 2010-09-06 DIAGNOSIS — I83893 Varicose veins of bilateral lower extremities with other complications: Secondary | ICD-10-CM

## 2010-09-07 NOTE — Consult Note (Signed)
NEW PATIENT CONSULTATION  [name] DOB:  01-29-1956                                       09/06/2010 AOZHY#:[id number]  The patient is a 55 year old female with pain over some venous insufficiency in the right calf.  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  She also has aching in both thighs laterally up to the hip areas.  She has no history of DVT, thrombophlebitis, stasis ulcers, bleeding and has worn intermittently short-leg elastic compression stockings in the past with no improvement.  CHRONIC MEDICAL PROBLEMS: 1. Hypertension. 2. Previous thyroidectomy currently on thyroid replacement, benign     disease. 3. 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 Lequita Halt. 4. Negative for diabetes, hyperlipidemia, coronary artery disease,     COPD or stroke.  SOCIAL HISTORY:  She is single, has 2 children, works in the [employer].  Does not use tobacco, has not since 1994.  Does not use alcohol.  FAMILY HISTORY:  Negative for coronary artery disease, diabetes and stroke.  REVIEW OF SYSTEMS:  GENERAL:  Positive for weight gain, leg discomfort with ambulation, arthritis, joint pain. All other systems negative in a complete review of systems.  PHYSICAL EXAM:  Vital signs:  Blood pressure 130/85, heart rate 93, respirations 20.  General:  She is a well-developed, well-nourished female in no apparent distress, alert and oriented x3.  HEENT:  Exam normal for age.  EOMs intact.  Lungs:  Clear to auscultation.  No rhonchi or wheezing.  Cardiovascular:  Regular rhythm.  No murmurs. Carotid pulses 3+.  No audible bruits.  Abdomen:  Obese.  No palpable masses.  Musculoskeletal:  Free of major deformities.  Neurological: Normal.  Skin:  Free of rashes.   Lower extremities:  Reveal 3+ femoral, popliteal and dorsalis pedis pulses palpable bilaterally.  Right leg has a focal area of bulging varix in the mid calf with a pattern of reticular and spider veins surrounding this over 3-4 cm diameter.  She has scattered reticular and spider veins in the thighs bilaterally.  No bulging varicosities in the left leg.  Both feet are adequately perfused with no edema.  Today I ordered a bilateral venous duplex exam which I reviewed and interpreted.  She does have some areas of reflux in the right great saphenous system at the junction and distally with a small vein.  There is also some reflux in the very mid portion on the left great saphenous vein with a small vein.  There is no DVT.  I think the best treatment for this lady would be primary sclerotherapy of the mainly symptomatic varix in the right calf.  We will discuss this further with her and if she would like to proceed will schedule it in the near future.    Quita Skye Hart [place], M.D. Electronically Signed  JDL/MEDQ  D:  09/06/2010  T:  09/07/2010  Job:  [id number]

## 2010-09-09 ENCOUNTER — Encounter: Payer: Self-pay | Admitting: *Deleted

## 2010-09-13 ENCOUNTER — Ambulatory Visit (HOSPITAL_COMMUNITY): Payer: Self-pay

## 2010-09-14 NOTE — Procedures (Unsigned)
LOWER EXTREMITY VENOUS REFLUX EXAM  INDICATION:  Pain and varicose veins.  EXAM:  Using color-flow imaging and pulse Doppler spectral analysis, the bilateral common femoral, femoral, popliteal, posterior tibial, great and small saphenous veins were evaluated.  There is no evidence suggesting deep venous insufficiency in the bilateral lower extremities.  The right saphenofemoral junction is not competent with reflux of >500 milliseconds.  The left saphenofemoral junction is competent.  The bilateral GSVs are not competent with reflux of >500 milliseconds with the calibers as described below.  The right proximal small saphenous vein demonstrates competency.  The left proximal small saphenous vein demonstrates incompetency and has diameter measurement ranges from 0.25 cm to 0.28 cm.  GSV Diameter (used if found to be incompetent only)                                           Right    Left Proximal Greater Saphenous Vein           0.75 cm  cm Proximal-to-mid-thigh                     0.44 cm  0.57 cm Mid thigh                                 0.44 cm  0.53 cm Mid-distal thigh                          cm       cm Distal thigh                              0.44 cm  0.38 cm Knee                                      0.36 cm  0.29 cm  IMPRESSION: 1. The bilateral great saphenous veins are not competent with reflux     of >500 milliseconds. 2. The right great saphenous vein is tortuous. 3. The left great saphenous vein is not tortuous. 4. The deep venous system is competent. 5. The right small saphenous vein is competent. 6. The left small saphenous vein is not competent with reflux of >500     milliseconds.       ___________________________________________ Quita Skye Hart [place], M.D.  SH/MEDQ  D:  09/07/2010  T:  09/07/2010  Job:  [id number]

## 2010-09-15 ENCOUNTER — Ambulatory Visit (HOSPITAL_COMMUNITY)
Admission: RE | Admit: 2010-09-15 | Discharge: 2010-09-15 | Disposition: A | Discharge status: Home / Self Care | Payer: 59 | Source: Ambulatory Visit | Attending: Obstetrics and Gynecology | Admitting: Obstetrics and Gynecology

## 2010-09-15 DIAGNOSIS — Z1231 Encounter for screening mammogram for malignant neoplasm of breast: Secondary | ICD-10-CM | POA: Insufficient documentation

## 2010-09-16 ENCOUNTER — Ambulatory Visit (INDEPENDENT_AMBULATORY_CARE_PROVIDER_SITE_OTHER): Payer: 59

## 2010-09-16 DIAGNOSIS — I83893 Varicose veins of bilateral lower extremities with other complications: Secondary | ICD-10-CM

## 2010-09-28 ENCOUNTER — Encounter: Payer: Self-pay | Admitting: Vascular Surgery

## 2010-12-06 LAB — BASIC METABOLIC PANEL
BUN: 8
CO2: 23
Chloride: 107
Creatinine, Ser: 0.85
Potassium: 3.9

## 2010-12-06 LAB — POCT HEMOGLOBIN-HEMACUE
Hemoglobin: 12.6
Operator id: 123881

## 2010-12-07 LAB — BASIC METABOLIC PANEL
BUN: 2 — ABNORMAL LOW
BUN: 4 — ABNORMAL LOW
BUN: 5 — ABNORMAL LOW
CO2: 30
Calcium: 7.7 — ABNORMAL LOW
Chloride: 100
Chloride: 103
Creatinine, Ser: 0.66
GFR calc non Af Amer: >60
Glucose, Bld: 113 — ABNORMAL HIGH
Glucose, Bld: 115 — ABNORMAL HIGH
Glucose, Bld: 138 — ABNORMAL HIGH
Potassium: 3.8
Potassium: 4.3
Sodium: 136

## 2010-12-07 LAB — CBC
HCT: 23.9 — ABNORMAL LOW
HCT: 25.6 — ABNORMAL LOW
Hemoglobin: 9 — ABNORMAL LOW
MCHC: 35.1
MCV: 86.7
MCV: 86.9
Platelets: 202
Platelets: 229
RDW: 14.2 — ABNORMAL HIGH
RDW: 14.7 — ABNORMAL HIGH
WBC: 7.7

## 2010-12-07 LAB — PROTIME-INR
INR: 2.2 — ABNORMAL HIGH
Prothrombin Time: 14.8
Prothrombin Time: 25.2 — ABNORMAL HIGH

## 2010-12-08 LAB — CBC
MCHC: 34.8
MCV: 87
RBC: 4.05

## 2010-12-08 LAB — CROSSMATCH: ABO/RH(D): O POS

## 2010-12-08 LAB — URINALYSIS, ROUTINE W REFLEX MICROSCOPIC
Bilirubin Urine: NEGATIVE
Ketones, ur: NEGATIVE
Nitrite: NEGATIVE
Urobilinogen, UA: 0.2
pH: 6

## 2010-12-08 LAB — COMPREHENSIVE METABOLIC PANEL
AST: 20
BUN: 13
CO2: 28
Calcium: 8.9
Creatinine, Ser: 0.79
GFR calc Af Amer: >60
GFR calc non Af Amer: >60
Glucose, Bld: 114 — ABNORMAL HIGH

## 2010-12-08 LAB — PROTIME-INR
INR: 1
Prothrombin Time: 13.4

## 2010-12-08 LAB — ABO/RH: ABO/RH(D): O POS

## 2010-12-08 LAB — APTT: aPTT: 28

## 2011-02-17 ENCOUNTER — Ambulatory Visit (INDEPENDENT_AMBULATORY_CARE_PROVIDER_SITE_OTHER): Payer: Commercial Managed Care - PPO | Admitting: General Surgery

## 2011-02-17 ENCOUNTER — Other Ambulatory Visit (INDEPENDENT_AMBULATORY_CARE_PROVIDER_SITE_OTHER): Payer: Self-pay | Admitting: General Surgery

## 2011-02-17 ENCOUNTER — Encounter (INDEPENDENT_AMBULATORY_CARE_PROVIDER_SITE_OTHER): Payer: Self-pay | Admitting: General Surgery

## 2011-02-17 DIAGNOSIS — E119 Type 2 diabetes mellitus without complications: Secondary | ICD-10-CM

## 2011-02-17 DIAGNOSIS — I10 Essential (primary) hypertension: Secondary | ICD-10-CM

## 2011-02-17 NOTE — Progress Notes (Signed)
Subjective:   Morbid obesity  Patient ID: Cathy Howell, female   DOB: 10-Jan-1956, 55 y.o.   MRN: [id number]  HPI Cathy Howell y.o.female presents for consideration for surgical treatment for morbid obesity.  she gives a history of progressive obesity since early adulthood despite multiple attempts at medical management.  her weight has been affecting her in a number of ways including Inability to perform routine activities and worsening health problems and concern her including diabetes and hypertension. she has been to our initial information seminar, researched surgical options thoroughly and is interested in Lap band surgery.  She also has noted a palpable mass under the skin in her mid epigastrium recently. A CT scan was performed for evaluation which showed a small hernia at the umbilicus but no abnormality in the area of concern.  Past Medical History  Diagnosis Date  . Arthritis   . Leg pain   . Hypertension   . Thyroid disease   . Diabetes mellitus   . Hyperlipidemia    Past Surgical History  Procedure Date  . Total hip arthroplasty 2005, 2007    bilateral  . Thyroidectomy 2009?   Current Outpatient Prescriptions  Medication Sig Dispense Refill  . citalopram (CELEXA) 40 MG tablet Take 40 mg by mouth daily.        Marland Kitchen levothyroxine (SYNTHROID, LEVOTHROID) 112 MCG tablet Take 112 mcg by mouth daily. Only on Saturday and sundays       . levothyroxine (SYNTHROID, LEVOTHROID) 125 MCG tablet Take 125 mcg by mouth daily. Monday through Friday only       . lisinopril (PRINIVIL,ZESTRIL) 40 MG tablet Take 40 mg by mouth daily.        . metFORMIN (GLUCOPHAGE) 500 MG tablet Take 500 mg by mouth 2 (two) times daily with a meal.        . simvastatin (ZOCOR) 20 MG tablet Take 20 mg by mouth at bedtime.         No Known Allergies History  Substance Use Topics  . Smoking status: Former Smoker    Quit date: 02/22/1992  . Smokeless tobacco: Not on file  . Alcohol Use: No    Review of  Systems  Constitutional: Negative for fever, activity change, appetite change and fatigue.  HENT: Negative.   Eyes: Negative.   Respiratory: Negative for cough, shortness of breath and wheezing.   Cardiovascular: Negative for chest pain, palpitations and leg swelling.  Gastrointestinal: Negative.   Musculoskeletal: Negative.        Objective:   Physical Exam General: Alert, morbidly obese Cauc[place] female, in no distress Skin: Warm and dry without rash or infection. Breasts: No skin changes or masses HEENT: No palpable masses or thyromegaly. Sclera nonicteric. Pupils equal round and reactive. Oropharynx clear.Healed thyroidectomy scar without mass Lymph nodes: No cervical, supraclavicular, or inguinal nodes palpable. Lungs: Breath sounds clear and equal without increased work of breathing Cardiovascular: Regular rate and rhythm without murmur. No JVD or edema. Peripheral pulses intact. Abdomen: Nondistended. Soft and nontender. No masses palpable. No organomegaly. No palpable hernias.In the mid epigastrium is an approximately 2 cm rounded rubbery deep subcutaneous mass Extremities: No edema or joint swelling or deformity. No chronic venous stasis changes. Neurologic: Alert and fully oriented. Gait normal.     Assessment:     Patient with progressive morbid obesity unresponsive to multiple efforts at medical management who presents with a BMI of 37.29 and comorbidities of Hypertension and diabetes mellitus oral agent controlled. I believe there would  be very significant medical benefit from surgical weight loss. After our discussion of surgical options currently available the patient has decided to proceed with LAP-BAND placement due to the reasons above. We have discussed the nature of morbid obesity and the risk of remaining obese. We discussed the indications for the procedure, its nature, and expected recovery. The risks of the procedure were discussed in detail including anesthetic  complications, bleeding, infection, visceral injury, dysphagia, and long-term risks of mechanical failure, slippage, erosion, esophageal dilatation and failure to lose weight. Rare risk of death was discussed. The patient was given a complete consent form and all questions were answered. We will proceed with preoperative workup including routine lab and x-rays, psychologic and nutrition evaluations, and upper GI series.  I will see the patient back following this evaluation.         Plan:    As above    We also discussed that the subcutaneous mass, probable lipoma, in her epigastrium could be removed at the time of her lap band surgery.

## 2011-02-21 ENCOUNTER — Ambulatory Visit (HOSPITAL_COMMUNITY)
Admission: RE | Admit: 2011-02-21 | Discharge: 2011-02-21 | Disposition: A | Discharge status: Home / Self Care | Payer: 59 | Source: Ambulatory Visit | Attending: General Surgery | Admitting: General Surgery

## 2011-02-21 ENCOUNTER — Other Ambulatory Visit: Payer: Self-pay

## 2011-02-21 DIAGNOSIS — M129 Arthropathy, unspecified: Secondary | ICD-10-CM | POA: Insufficient documentation

## 2011-02-21 DIAGNOSIS — K219 Gastro-esophageal reflux disease without esophagitis: Secondary | ICD-10-CM | POA: Insufficient documentation

## 2011-02-21 DIAGNOSIS — E119 Type 2 diabetes mellitus without complications: Secondary | ICD-10-CM | POA: Insufficient documentation

## 2011-02-21 DIAGNOSIS — I10 Essential (primary) hypertension: Secondary | ICD-10-CM | POA: Insufficient documentation

## 2011-02-21 DIAGNOSIS — E785 Hyperlipidemia, unspecified: Secondary | ICD-10-CM | POA: Insufficient documentation

## 2011-02-21 DIAGNOSIS — Z6837 Body mass index (BMI) 37.0-37.9, adult: Secondary | ICD-10-CM | POA: Insufficient documentation

## 2011-02-21 DIAGNOSIS — E079 Disorder of thyroid, unspecified: Secondary | ICD-10-CM | POA: Insufficient documentation

## 2011-03-03 ENCOUNTER — Encounter: Payer: Self-pay | Admitting: *Deleted

## 2011-03-03 ENCOUNTER — Encounter: Payer: 59 | Attending: Family Medicine | Admitting: *Deleted

## 2011-03-03 DIAGNOSIS — Z01818 Encounter for other preprocedural examination: Secondary | ICD-10-CM | POA: Insufficient documentation

## 2011-03-03 DIAGNOSIS — Z713 Dietary counseling and surveillance: Secondary | ICD-10-CM | POA: Insufficient documentation

## 2011-03-03 NOTE — Patient Instructions (Signed)
   Follow Pre-Op Nutrition Goals to prepare for LAGB Surgery.   Call the [facility] at [phone] once you have been given your surgery date to enrolled in the Pre-Op Nutrition Class. You will need to attend this nutrition class 3-4 weeks prior to your surgery. 

## 2011-03-03 NOTE — Progress Notes (Signed)
  Pre-Op Assessment Visit: Pre-Operative LAGB Surgery  Medical Nutrition Therapy:  Appt start time: 1500 end time:  1600.  Patient was seen on 03/03/2011 for Pre-Operative LAGB Nutrition Assessment. Assessment and letter of approval faxed to [facility] Bariatric Surgery Program coordinator on 03/03/2011.  Approval letter sent to [facility] center and will be available in the chart under the media tab.  Handouts given during visit include:  Pre-Op Goals Handout  Patient to call for Pre-Op and Post-Op Nutrition Education at the [facility] when surgery is scheduled.

## 2011-03-10 ENCOUNTER — Other Ambulatory Visit (INDEPENDENT_AMBULATORY_CARE_PROVIDER_SITE_OTHER): Payer: Self-pay

## 2011-03-17 ENCOUNTER — Other Ambulatory Visit (INDEPENDENT_AMBULATORY_CARE_PROVIDER_SITE_OTHER): Payer: Self-pay | Admitting: General Surgery

## 2011-03-18 LAB — CBC WITH DIFFERENTIAL/PLATELET
Basophils Relative: 0 % (ref 0–1)
Hemoglobin: 12.9 g/dL (ref 12.0–15.0)
Lymphocytes Relative: 25 % (ref 12–46)
Lymphs Abs: 2.3 10*3/uL (ref 0.7–4.0)
MCHC: 31.5 g/dL (ref 30.0–36.0)
Monocytes Relative: 7 % (ref 3–12)
Neutro Abs: 6.2 10*3/uL (ref 1.7–7.7)
Neutrophils Relative %: 66 % (ref 43–77)
RBC: 4.57 MIL/uL (ref 3.87–5.11)
WBC: 9.3 10*3/uL (ref 4.0–10.5)

## 2011-05-26 ENCOUNTER — Encounter: Payer: 59 | Attending: Family Medicine | Admitting: *Deleted

## 2011-05-26 DIAGNOSIS — Z01818 Encounter for other preprocedural examination: Secondary | ICD-10-CM | POA: Insufficient documentation

## 2011-05-26 DIAGNOSIS — Z713 Dietary counseling and surveillance: Secondary | ICD-10-CM | POA: Insufficient documentation

## 2011-05-26 NOTE — Patient Instructions (Signed)
Follow:   Pre-Op Diet per MD 2 weeks prior to surgery  Phase 2- Liquids (clear/full) 2 weeks after surgery  Vitamin/Mineral/Calcium guidelines for purchasing bariatric supplements  Exercise guidelines pre and post-op per MD  Follow-up at [facility] in 2 weeks post-op for diet advancement. Contact Talishia Betzler as needed with questions/concerns. 

## 2011-05-26 NOTE — Progress Notes (Signed)
  Bariatric Class:  Appt start time: 0830 end time:  0930.  Pre-Operative Nutrition Class  Patient was seen on 05/26/2011 for Pre-Operative Bariatric Surgery Education at the [facility].  Surgery date: 06/13/11 Surgery type: LAGB  Samples given per MNT protocol: Bariatric Advantage Multivitamin [facility]) Lot #  [id number] Exp: 09/13  Bariatric Advantage VitaBand Multivitamin  Lot #  [id number] Exp: 09/13  Bariatric Advantage Calcium Citrate Lozenge (Chocolate) Lot # [id number] Exp: 09/13  Bariatric Advantage B-12 (Peppermint) Lot # [id number] Exp: 05/13  Celebrate Vitamins Calcium Citrate (Strawberry Creme) Lot # [id number] Exp: 04/13  Corliss Marcus Protein Powder (Chocolate Splendor) Lot # [id number] Exp: 05/14  The following the learning objective met by the patient during this course:   Identifies Pre-Op Dietary Goals and will begin 2 weeks pre-operatively   Identifies appropriate sources of fluids and proteins   States protein recommendations and appropriate sources pre and post-operatively  Identifies Post-Operative Dietary Goals and will follow for 2 weeks post-operatively  Identifies appropriate multivitamin and calcium sources  Describes the need for physical activity post-operatively and will follow MD recommendations  States when to call healthcare provider regarding medication questions or post-operative complications  Handouts given during class include:  Pre-Op Bariatric Surgery Diet Handout  Protein Shake Handout  Post-Op Bariatric Surgery Nutrition Handout  BELT Program Information Flyer  Support Group Information Flyer  Follow-Up Plan: Patient will follow-up at [facility] 2 weeks post operatively for diet advancement per MD.

## 2011-06-02 ENCOUNTER — Encounter (HOSPITAL_COMMUNITY): Payer: Self-pay

## 2011-06-03 ENCOUNTER — Ambulatory Visit (INDEPENDENT_AMBULATORY_CARE_PROVIDER_SITE_OTHER): Payer: Commercial Managed Care - PPO | Admitting: General Surgery

## 2011-06-03 ENCOUNTER — Encounter (INDEPENDENT_AMBULATORY_CARE_PROVIDER_SITE_OTHER): Payer: Self-pay | Admitting: General Surgery

## 2011-06-03 ENCOUNTER — Encounter (HOSPITAL_COMMUNITY)
Admission: RE | Admit: 2011-06-03 | Discharge: 2011-06-03 | Disposition: A | Discharge status: Home / Self Care | Payer: 59 | Source: Ambulatory Visit | Attending: General Surgery | Admitting: General Surgery

## 2011-06-03 ENCOUNTER — Encounter (HOSPITAL_COMMUNITY): Payer: Self-pay

## 2011-06-03 ENCOUNTER — Other Ambulatory Visit (INDEPENDENT_AMBULATORY_CARE_PROVIDER_SITE_OTHER): Payer: Self-pay | Admitting: General Surgery

## 2011-06-03 HISTORY — DX: Major depressive disorder, single episode, unspecified: F32.9

## 2011-06-03 HISTORY — DX: Depression, unspecified: F32.A

## 2011-06-03 LAB — BASIC METABOLIC PANEL
BUN: 19 mg/dL (ref 6–23)
CO2: 22 mEq/L (ref 19–32)
Calcium: 9.3 mg/dL (ref 8.4–10.5)
Creatinine, Ser: 0.69 mg/dL (ref 0.50–1.10)
Glucose, Bld: 94 mg/dL (ref 70–99)

## 2011-06-03 LAB — CBC
Hemoglobin: 12.8 g/dL (ref 12.0–15.0)
MCH: 28.3 pg (ref 26.0–34.0)
MCHC: 32.6 g/dL (ref 30.0–36.0)
MCV: 86.8 fL (ref 78.0–100.0)
RBC: 4.53 MIL/uL (ref 3.87–5.11)

## 2011-06-03 NOTE — Progress Notes (Signed)
Chief complaint: Preop lap band surgery  History: Patient returns for preop visit prior to plan lap band surgery. She has successfully completed her preoperative workup. We reviewed her nutrition and psych evaluations which were remarkable. Her upper GI series showed a possible small sliding hiatal hernia. She does not have reflux. I told him to check for this during surgery. She does complain today of some nagging or pulling discomfort in her right upper quadrant. She has no associated GI symptoms. She had a CT scan in 2010 showing only a small umbilical hernia. Past Medical History  Diagnosis Date  . Arthritis   . Leg pain   . Hypertension   . Thyroid disease   . Diabetes mellitus   . Hyperlipidemia   . Hypothyroidism   . Blood transfusion     hip surgery  . Anxiety   . Depression    Past Surgical History  Procedure Date  . Total hip arthroplasty 2005, 2007    bilateral  . Thyroidectomy 2009?  . Tubal ligation   . Tonsillectomy   . Left rotator cuff 2007   Current Outpatient Prescriptions  Medication Sig Dispense Refill  . lisinopril (PRINIVIL,ZESTRIL) 20 MG tablet Take 20 mg by mouth daily.      . Calcium Carbonate-Vitamin D (CALCIUM + D PO) Take 5 mLs by mouth 3 (three) times daily. 1000 mg per 5 ml      . citalopram (CELEXA) 40 MG tablet Take 40 mg by mouth at bedtime.       . geriatric multivitamins-minerals (ELDERTONIC/GEVRABON) ELIX Take 15 mLs by mouth daily.      . levothyroxine (SYNTHROID, LEVOTHROID) 112 MCG tablet Take 112 mcg by mouth daily. Only on Saturday and sundays      . levothyroxine (SYNTHROID, LEVOTHROID) 125 MCG tablet Take 125 mcg by mouth daily. Monday through Friday only      . simvastatin (ZOCOR) 20 MG tablet Take 20 mg by mouth at bedtime.        Exam:  BP 118/76  Pulse 70  Temp(Src) 97.2 F (36.2 C) (Temporal)  Resp 16  Ht 5' 1" (1.549 m)  Wt 193 lb (87.544 kg)  BMI 36.47 kg/m2  General: Alert, morbidly obese [place] female, in no  distress  Skin: Warm and dry without rash or infection.  Breasts: No skin changes or masses  HEENT: No palpable masses or thyromegaly. Sclera nonicteric. Pupils equal round and reactive. Oropharynx clear.Healed thyroidectomy scar without mass  Lymph nodes: No cervical, supraclavicular, or inguinal nodes palpable.  Lungs: Breath sounds clear and equal without increased work of breathing  Cardiovascular: Regular rate and rhythm without murmur. No JVD or edema. Peripheral pulses intact.  Abdomen: Nondistended. Soft and nontender. No masses palpable. No organomegaly. No palpable hernias.In the mid epigastrium is an approximately 2 cm rounded rubbery deep subcutaneous mass  Extremities: No edema or joint swelling or deformity. No chronic venous stasis changes.  Neurologic: Alert and fully oriented. Gait normal.   Assessment and plan: Morbid obesity with comorbidities of hypertension and diabetes mellitus oral agent controlled. Reviewed the consent form and all her questions about the surgery were answered. Due to her right upper quadrant discomfort going to get a preoperative ultrasound to rule out cholelithiasis and should this show gallstones I would plan cholecystectomy at the time of her lap band surgery this was discussed with her. 

## 2011-06-03 NOTE — Patient Instructions (Addendum)
21 Cathy Howell  06/03/2011   Your procedure is scheduled on:  Monday 06/13/2011 at 1000  Report to [facility] at 0730 AM.  Call this number if you have problems the morning of surgery: [phone]   Remember:   Do not eat food:After Midnight.  May have clear liquids:until Midnight .    Take these medicines the morning of surgery with A SIP OF WATER: Synthroid   Do not wear jewelry, make-up or nail polish.  Do not wear lotions, powders, or perfumes.   Do not shave 48 hours prior to surgery.-women only-shaving legs  Do not bring valuables to the hospital.  Contacts, dentures or bridgework may not be worn into surgery.  Leave suitcase in the car. After surgery it may be brought to your room.  For patients admitted to the hospital, checkout time is 11:00 AM the day of discharge.   Patients discharged the day of surgery will not be allowed to drive home.  Name and phone number of your driver:   Special Instructions: CHG Shower Use Special Wash: 1/2 bottle night before surgery and 1/2 bottle morning of surgery.   Please read over the following fact sheets that you were given: MRSA Information,Incentive Spirometry sheet, Sleep apnea

## 2011-06-07 ENCOUNTER — Ambulatory Visit (HOSPITAL_COMMUNITY)
Admission: RE | Admit: 2011-06-07 | Discharge: 2011-06-07 | Disposition: A | Discharge status: Home / Self Care | Payer: 59 | Source: Ambulatory Visit | Attending: General Surgery | Admitting: General Surgery

## 2011-06-07 DIAGNOSIS — K7689 Other specified diseases of liver: Secondary | ICD-10-CM | POA: Insufficient documentation

## 2011-06-07 DIAGNOSIS — E785 Hyperlipidemia, unspecified: Secondary | ICD-10-CM | POA: Insufficient documentation

## 2011-06-07 DIAGNOSIS — Z6836 Body mass index (BMI) 36.0-36.9, adult: Secondary | ICD-10-CM | POA: Insufficient documentation

## 2011-06-07 DIAGNOSIS — E119 Type 2 diabetes mellitus without complications: Secondary | ICD-10-CM | POA: Insufficient documentation

## 2011-06-07 DIAGNOSIS — E039 Hypothyroidism, unspecified: Secondary | ICD-10-CM | POA: Insufficient documentation

## 2011-06-07 DIAGNOSIS — I10 Essential (primary) hypertension: Secondary | ICD-10-CM | POA: Insufficient documentation

## 2011-06-08 ENCOUNTER — Telehealth (INDEPENDENT_AMBULATORY_CARE_PROVIDER_SITE_OTHER): Payer: Self-pay

## 2011-06-08 NOTE — Telephone Encounter (Signed)
Left message for patient to call our office for U/S results. (per Dr. Johna Sheriff u/s looks OK)

## 2011-06-09 ENCOUNTER — Telehealth (INDEPENDENT_AMBULATORY_CARE_PROVIDER_SITE_OTHER): Payer: Self-pay

## 2011-06-09 NOTE — Telephone Encounter (Signed)
Abdominal ultrasound results given to patient.

## 2011-06-13 ENCOUNTER — Ambulatory Visit (HOSPITAL_COMMUNITY): Payer: 59 | Admitting: Anesthesiology

## 2011-06-13 ENCOUNTER — Ambulatory Visit (HOSPITAL_COMMUNITY): Payer: 59

## 2011-06-13 ENCOUNTER — Ambulatory Visit (HOSPITAL_COMMUNITY)
Admission: RE | Admit: 2011-06-13 | Discharge: 2011-06-13 | Disposition: A | Discharge status: Home / Self Care | Payer: 59 | Source: Ambulatory Visit | Attending: General Surgery | Admitting: General Surgery

## 2011-06-13 ENCOUNTER — Encounter (HOSPITAL_COMMUNITY)
Admission: RE | Disposition: A | Discharge status: Home / Self Care | Payer: Self-pay | Source: Ambulatory Visit | Attending: General Surgery

## 2011-06-13 ENCOUNTER — Encounter (HOSPITAL_COMMUNITY): Payer: Self-pay

## 2011-06-13 ENCOUNTER — Encounter (HOSPITAL_COMMUNITY): Payer: Self-pay | Admitting: Anesthesiology

## 2011-06-13 DIAGNOSIS — Z01812 Encounter for preprocedural laboratory examination: Secondary | ICD-10-CM | POA: Insufficient documentation

## 2011-06-13 DIAGNOSIS — E079 Disorder of thyroid, unspecified: Secondary | ICD-10-CM | POA: Insufficient documentation

## 2011-06-13 DIAGNOSIS — E119 Type 2 diabetes mellitus without complications: Secondary | ICD-10-CM | POA: Insufficient documentation

## 2011-06-13 DIAGNOSIS — I10 Essential (primary) hypertension: Secondary | ICD-10-CM

## 2011-06-13 DIAGNOSIS — Z6836 Body mass index (BMI) 36.0-36.9, adult: Secondary | ICD-10-CM

## 2011-06-13 DIAGNOSIS — Z96649 Presence of unspecified artificial hip joint: Secondary | ICD-10-CM | POA: Insufficient documentation

## 2011-06-13 DIAGNOSIS — I776 Arteritis, unspecified: Secondary | ICD-10-CM

## 2011-06-13 DIAGNOSIS — Z79899 Other long term (current) drug therapy: Secondary | ICD-10-CM | POA: Insufficient documentation

## 2011-06-13 DIAGNOSIS — K449 Diaphragmatic hernia without obstruction or gangrene: Secondary | ICD-10-CM

## 2011-06-13 DIAGNOSIS — E785 Hyperlipidemia, unspecified: Secondary | ICD-10-CM | POA: Insufficient documentation

## 2011-06-13 DIAGNOSIS — R1011 Right upper quadrant pain: Secondary | ICD-10-CM | POA: Insufficient documentation

## 2011-06-13 HISTORY — PX: HIATAL HERNIA REPAIR: SHX195

## 2011-06-13 HISTORY — PX: LAPAROSCOPIC GASTRIC BANDING: SHX1100

## 2011-06-13 LAB — PREGNANCY, URINE: Preg Test, Ur: NEGATIVE

## 2011-06-13 SURGERY — GASTRIC BANDING, LAPAROSCOPIC
Anesthesia: General | Site: Abdomen | Wound class: Clean

## 2011-06-13 MED ORDER — ACETAMINOPHEN 10 MG/ML IV SOLN
INTRAVENOUS | Status: AC
  Filled 2011-06-13: qty 100

## 2011-06-13 MED ORDER — FENTANYL CITRATE 0.05 MG/ML IJ SOLN
INTRAMUSCULAR | Status: DC | PRN
  Administered 2011-06-13: 100 ug via INTRAVENOUS
  Administered 2011-06-13 (×3): 50 ug via INTRAVENOUS

## 2011-06-13 MED ORDER — MEPERIDINE HCL 50 MG/ML IJ SOLN: 6.2500 mg | INTRAMUSCULAR | Status: DC | PRN

## 2011-06-13 MED ORDER — SODIUM CHLORIDE 0.9 % IJ SOLN
INTRAMUSCULAR | Status: DC | PRN
  Administered 2011-06-13: 20 mL via INTRAVENOUS

## 2011-06-13 MED ORDER — NEOSTIGMINE METHYLSULFATE 1 MG/ML IJ SOLN
INTRAMUSCULAR | Status: DC | PRN
  Administered 2011-06-13: 3 mg via INTRAVENOUS

## 2011-06-13 MED ORDER — DEXTROSE 5 % IV SOLN
2.0000 g | INTRAVENOUS | Status: AC
  Administered 2011-06-13: 2 g via INTRAVENOUS

## 2011-06-13 MED ORDER — MIDAZOLAM HCL 5 MG/5ML IJ SOLN
INTRAMUSCULAR | Status: DC | PRN
  Administered 2011-06-13: 2 mg via INTRAVENOUS

## 2011-06-13 MED ORDER — HEPARIN SODIUM (PORCINE) 5000 UNIT/ML IJ SOLN
5000.0000 [IU] | INTRAMUSCULAR | Status: AC
  Administered 2011-06-13: 5000 [IU] via SUBCUTANEOUS

## 2011-06-13 MED ORDER — CEFOXITIN SODIUM-DEXTROSE 1-4 GM-% IV SOLR (PREMIX)
INTRAVENOUS | Status: AC
  Filled 2011-06-13: qty 100

## 2011-06-13 MED ORDER — PROPOFOL 10 MG/ML IV EMUL
INTRAVENOUS | Status: DC | PRN
  Administered 2011-06-13: 150 mg via INTRAVENOUS

## 2011-06-13 MED ORDER — GLYCOPYRROLATE 0.2 MG/ML IJ SOLN
INTRAMUSCULAR | Status: DC | PRN
  Administered 2011-06-13: 0.4 mg via INTRAVENOUS

## 2011-06-13 MED ORDER — DROPERIDOL 2.5 MG/ML IJ SOLN
INTRAMUSCULAR | Status: DC | PRN
  Administered 2011-06-13: 0.625 mg via INTRAVENOUS

## 2011-06-13 MED ORDER — PROMETHAZINE HCL 25 MG/ML IJ SOLN: 6.2500 mg | INTRAMUSCULAR | Status: DC | PRN

## 2011-06-13 MED ORDER — BUPIVACAINE-EPINEPHRINE (PF) 0.5% -1:200000 IJ SOLN
INTRAMUSCULAR | Status: AC
  Filled 2011-06-13: qty 10

## 2011-06-13 MED ORDER — HYDROMORPHONE HCL PF 1 MG/ML IJ SOLN: 0.2500 mg | INTRAMUSCULAR | Status: DC | PRN

## 2011-06-13 MED ORDER — DEXAMETHASONE SODIUM PHOSPHATE 10 MG/ML IJ SOLN
INTRAMUSCULAR | Status: DC | PRN
  Administered 2011-06-13: 10 mg via INTRAVENOUS

## 2011-06-13 MED ORDER — HEPARIN SODIUM (PORCINE) 5000 UNIT/ML IJ SOLN
INTRAMUSCULAR | Status: AC
  Administered 2011-06-13: 5000 [IU] via SUBCUTANEOUS
  Filled 2011-06-13: qty 1

## 2011-06-13 MED ORDER — LACTATED RINGERS IV SOLN
INTRAVENOUS | Status: DC | PRN
  Administered 2011-06-13: 09:00:00 via INTRAVENOUS

## 2011-06-13 MED ORDER — OXYCODONE-ACETAMINOPHEN 5-325 MG/5ML PO SOLN: 510.0000 mL | ORAL | Status: AC | PRN

## 2011-06-13 MED ORDER — LACTATED RINGERS IV SOLN: INTRAVENOUS | Status: DC

## 2011-06-13 MED ORDER — BUPIVACAINE-EPINEPHRINE 0.5% -1:200000 IJ SOLN
INTRAMUSCULAR | Status: DC | PRN
  Administered 2011-06-13: 40 mL

## 2011-06-13 MED ORDER — 0.9 % SODIUM CHLORIDE (POUR BTL) OPTIME
TOPICAL | Status: DC | PRN
  Administered 2011-06-13: 1000 mL

## 2011-06-13 MED ORDER — ACETAMINOPHEN 10 MG/ML IV SOLN
INTRAVENOUS | Status: DC | PRN
  Administered 2011-06-13: 1000 mg via INTRAVENOUS

## 2011-06-13 MED ORDER — ROCURONIUM BROMIDE 100 MG/10ML IV SOLN
INTRAVENOUS | Status: DC | PRN
  Administered 2011-06-13: 40 mg via INTRAVENOUS

## 2011-06-13 SURGICAL SUPPLY — 58 items
BAND LAP 10.0 W/TUBES (Band) ×3 IMPLANT
BLADE HEX COATED 2.75 (ELECTRODE) ×3 IMPLANT
BLADE SURG 15 STRL LF DISP TIS (BLADE) IMPLANT
BLADE SURG 15 STRL SS (BLADE)
BLADE SURG SZ11 CARB STEEL (BLADE) ×3 IMPLANT
CABLE HIGH FREQUENCY MONO STRZ (ELECTRODE) ×3 IMPLANT
CANISTER SUCTION 2500CC (MISCELLANEOUS) ×3 IMPLANT
CLOTH BEACON ORANGE TIMEOUT ST (SAFETY) ×3 IMPLANT
COVER SURGICAL LIGHT HANDLE (MISCELLANEOUS) ×3 IMPLANT
DECANTER SPIKE VIAL GLASS SM (MISCELLANEOUS) ×6 IMPLANT
DERMABOND ADVANCED (GAUZE/BANDAGES/DRESSINGS) ×1
DERMABOND ADVANCED .7 DNX12 (GAUZE/BANDAGES/DRESSINGS) ×2 IMPLANT
DEVICE SUT QUICK LOAD TK 5 (STAPLE) ×12 IMPLANT
DEVICE SUT TI-KNOT TK 5X26 (MISCELLANEOUS) ×3 IMPLANT
DEVICE SUTURE ENDOST 10MM (ENDOMECHANICALS) ×3 IMPLANT
DRAPE CAMERA CLOSED 9X96 (DRAPES) ×3 IMPLANT
ELECT REM PT RETURN 9FT ADLT (ELECTROSURGICAL) ×3
ELECTRODE REM PT RTRN 9FT ADLT (ELECTROSURGICAL) ×2 IMPLANT
GLOVE BIOGEL PI IND STRL 7.0 (GLOVE) ×2 IMPLANT
GLOVE BIOGEL PI INDICATOR 7.0 (GLOVE) ×1
GLOVE SS BIOGEL STRL SZ 7.5 (GLOVE) ×2 IMPLANT
GLOVE SUPERSENSE BIOGEL SZ 7.5 (GLOVE) ×1
GOWN STRL NON-REIN LRG LVL3 (GOWN DISPOSABLE) ×3 IMPLANT
GOWN STRL REIN XL XLG (GOWN DISPOSABLE) ×6 IMPLANT
HOVERMATT SINGLE USE (MISCELLANEOUS) ×3 IMPLANT
KIT BASIN OR (CUSTOM PROCEDURE TRAY) ×3 IMPLANT
MESH HERNIA 1X4 RECT BARD (Mesh General) ×2 IMPLANT
MESH HERNIA BARD 1X4 (Mesh General) ×1 IMPLANT
NEEDLE SPNL 22GX3.5 QUINCKE BK (NEEDLE) ×3 IMPLANT
NS IRRIG 1000ML POUR BTL (IV SOLUTION) ×3 IMPLANT
PACK UNIVERSAL I (CUSTOM PROCEDURE TRAY) ×3 IMPLANT
PENCIL BUTTON HOLSTER BLD 10FT (ELECTRODE) ×3 IMPLANT
SCALPEL HARMONIC ACE (MISCELLANEOUS) IMPLANT
SET IRRIG TUBING LAPAROSCOPIC (IRRIGATION / IRRIGATOR) IMPLANT
SLEEVE ADV FIXATION 5X100MM (TROCAR) ×3 IMPLANT
SOLUTION ANTI FOG 6CC (MISCELLANEOUS) ×3 IMPLANT
SPONGE LAP 18X18 X RAY DECT (DISPOSABLE) ×3 IMPLANT
STAPLER VISISTAT 35W (STAPLE) ×3 IMPLANT
SUT ETHIBOND 2 0 SH (SUTURE) ×3
SUT ETHIBOND 2 0 SH 36X2 (SUTURE) ×6 IMPLANT
SUT MNCRL AB 4-0 PS2 18 (SUTURE) ×3 IMPLANT
SUT PROLENE 2 0 CT2 30 (SUTURE) ×3 IMPLANT
SUT SILK 0 (SUTURE) ×1
SUT SILK 0 30XBRD TIE 6 (SUTURE) ×2 IMPLANT
SUT SURGIDAC NAB ES-9 0 48 120 (SUTURE) ×3 IMPLANT
SUT VIC AB 2-0 SH 27 (SUTURE) ×1
SUT VIC AB 2-0 SH 27X BRD (SUTURE) ×2 IMPLANT
SYR 20CC LL (SYRINGE) ×3 IMPLANT
SYR CONTROL 10ML LL (SYRINGE) ×3 IMPLANT
SYS KII OPTICAL ACCESS 15MM (TROCAR) ×3
SYSTEM KII OPTICAL ACCESS 15MM (TROCAR) ×2 IMPLANT
TOWEL OR 17X26 10 PK STRL BLUE (TOWEL DISPOSABLE) ×3 IMPLANT
TROCAR ADV FIXATION 11X100MM (TROCAR) IMPLANT
TROCAR BLADELESS OPT 5 100 (ENDOMECHANICALS) ×3 IMPLANT
TROCAR XCEL NON-BLD 11X100MML (ENDOMECHANICALS) IMPLANT
TROCAR Z-THREAD FIOS 5X100MM (TROCAR) ×3 IMPLANT
TUBE CALIBRATION LAPBAND (TUBING) ×3 IMPLANT
TUBING INSUFFLATION 10FT LAP (TUBING) ×3 IMPLANT

## 2011-06-13 NOTE — Progress Notes (Signed)
Pt ambulated in hall to bathroom and voided clear yellow urine.  Pt tolerated well.

## 2011-06-13 NOTE — Anesthesia Preprocedure Evaluation (Signed)
Anesthesia Evaluation  Patient identified by MRN, date of birth, ID band Patient awake    Reviewed: Allergy & Precautions, H&P , NPO status , Patient's Chart, lab work & pertinent test results  Airway Mallampati: II TM Distance: >3 FB Neck ROM: Full    Dental No notable dental hx.    Pulmonary neg pulmonary ROS, former smoker breath sounds clear to auscultation  Pulmonary exam normal       Cardiovascular hypertension, Pt. on medications negative cardio ROS  Rhythm:Regular Rate:Normal     Neuro/Psych PSYCHIATRIC DISORDERS Anxiety Depression negative neurological ROS  negative psych ROS   GI/Hepatic negative GI ROS, Neg liver ROS,   Endo/Other  negative endocrine ROSDiabetes mellitus-, Type 2, Oral Hypoglycemic AgentsHypothyroidism   Renal/GU negative Renal ROS  negative genitourinary   Musculoskeletal negative musculoskeletal ROS (+)   Abdominal   Peds negative pediatric ROS (+)  Hematology negative hematology ROS (+)   Anesthesia Other Findings   Reproductive/Obstetrics negative OB ROS                           Anesthesia Physical Anesthesia Plan  ASA: III  Anesthesia Plan: General   Post-op Pain Management:    Induction: Intravenous  Airway Management Planned: Oral ETT  Additional Equipment:   Intra-op Plan:   Post-operative Plan: Extubation in OR  Informed Consent: I have reviewed the patients History and Physical, chart, labs and discussed the procedure including the risks, benefits and alternatives for the proposed anesthesia with the patient or authorized representative who has indicated his/her understanding and acceptance.   Dental advisory given  Plan Discussed with: CRNA  Anesthesia Plan Comments:         Anesthesia Quick Evaluation

## 2011-06-13 NOTE — Transfer of Care (Signed)
Immediate Anesthesia Transfer of Care Note  Patient: Cathy Howell  Procedure(s) Performed: Procedure(s) (LRB): LAPAROSCOPIC GASTRIC BANDING (N/A) LAPAROSCOPIC REPAIR OF HIATAL HERNIA ()  Patient Location: PACU  Anesthesia Type: General  Level of Consciousness: awake and alert   Airway & Oxygen Therapy: Patient Spontanous Breathing and Patient connected to face mask oxygen  Post-op Assessment: Report given to PACU RN and Post -op Vital signs reviewed and stable  Post vital signs: Reviewed and stable  Complications: No apparent anesthesia complications

## 2011-06-13 NOTE — Op Note (Signed)
Preop diagnosis: Morbid obesity   Postop diagnosis: Same  Surgical procedure: Placement of laparoscopic adjustable gastric band  Surgeon:Jguadalupe Opiela M.D.  Assistant.: Luretha Murphy MD  Anesthesia: General  Complications: None  EBL: Minimal  Description of procedure: Patient is brought to the operating room, placed in the supine position on the operating table and general anesthesia was induced. Patient did receive preoperative IV antibiotics and subcutaneous heparin and PAS were in place. The abdomen was widely sterilely prepped and draped and correct patient and procedure verified with the patient time out. Trocar sites were infiltrated with local anesthesia. Access was obtained with a 5 mm Optiview trocar in the left upper quadrant without difficulty and pneumoperitoneum was established. There was no evidence of trocar injury. Under direct vision a 5 mm trocar was placed laterally in the right upper quadrant, a 15 mm trocar in the right upper quadrant midclavicular line, and an 5 mm trocar just above and to the left of the umbilicus for the camera port. The patient was placed in steep reverse Trendelenburg position and and through a 5 mm subxiphoid site the [facility] retractor was placed in the left lobe of the liver elevated with actual exposure of the hiatus and upper stomach. Initially the peritoneum over the left crus was incised and careful blunt dissection carried back down along the left crus toward the retrogastric area. The pars flaccida was opened in an avascular area in the base of the right crus at the area of crossing fat was exposed. The patient had a small hiatal hernia on pre op workup on UGI.  The sizing balloon was passed and with 10cc in the balloon it pulled through the hiatus with minimal traction.  I therefore dissected the crura and found a small but definite hernia.  This was repaired with a single posterior 0 Ethibond suture closing the hiatus to normal size.  The  10cc balloon then pulled snugly back against the hiatus. The peritoneum at the crossing fat was incised and the finger dissector was passed into this space and advanced retrogastric without difficulty and deployed up through the previously dissected area at the angle of Hiss. A flushed AP Standard lap band system was introduced into the abdomen. The tubing was placed in the finger dissector and brought retrogastric and the band brought back through the retrogastric tunnel without difficulty. With the sizing tube placed orally into the stomach the band was locked into position without any undue tension and the sizing tube was removed. Holding the tubing toward the patient's feet exposing the small gastric pouch the fundus was sutured up over the band to the pouch with 3 interrupted 2-0 Ethibond sutures. The band appeared to be in excellent position. There was no evidence of bleeding or trocar injury or other problems. The Nathanson retractor was removed under direct vision. All CO2 was evacuated and trochars removed. The tubing which had been brought through the right mid abdominal trocar site was cut and attached to the port to which a piece of Prolene mesh had been sutured to the back. This incision was lengthened slightly in a subcutaneous pocket created. The port was positioned subcutaneously and the tubing introduced bluntly into the abdomen. This incision was closed with subcutaneous running 2-0 Vicryl and all skin incisions were closed with subcutaneous 4-0 Monocryl and Dermabond. Sponge needle and instrument counts were correct. The patient was taken to the PACU in good condition.  Mariella Saa MD, FACS  01/04/2011, 9:11 AM

## 2011-06-13 NOTE — H&P (View-Only) (Signed)
Chief complaint: Preop lap band surgery  History: Patient returns for preop visit prior to plan lap band surgery. She has successfully completed her preoperative workup. We reviewed her nutrition and psych evaluations which were remarkable. Her upper GI series showed a possible small sliding hiatal hernia. She does not have reflux. I told him to check for this during surgery. She does complain today of some nagging or pulling discomfort in her right upper quadrant. She has no associated GI symptoms. She had a CT scan in 2010 showing only a small umbilical hernia. Past Medical History  Diagnosis Date  . Arthritis   . Leg pain   . Hypertension   . Thyroid disease   . Diabetes mellitus   . Hyperlipidemia   . Hypothyroidism   . Blood transfusion     hip surgery  . Anxiety   . Depression    Past Surgical History  Procedure Date  . Total hip arthroplasty 2005, 2007    bilateral  . Thyroidectomy 2009?  Marland Kitchen Tubal ligation   . Tonsillectomy   . Left rotator cuff 2007   Current Outpatient Prescriptions  Medication Sig Dispense Refill  . lisinopril (PRINIVIL,ZESTRIL) 20 MG tablet Take 20 mg by mouth daily.      . Calcium Carbonate-Vitamin D (CALCIUM + D PO) Take 5 mLs by mouth 3 (three) times daily. 1000 mg per 5 ml      . citalopram (CELEXA) 40 MG tablet Take 40 mg by mouth at bedtime.       Marland Kitchen geriatric multivitamins-minerals (ELDERTONIC/GEVRABON) ELIX Take 15 mLs by mouth daily.      Marland Kitchen levothyroxine (SYNTHROID, LEVOTHROID) 112 MCG tablet Take 112 mcg by mouth daily. Only on Saturday and sundays      . levothyroxine (SYNTHROID, LEVOTHROID) 125 MCG tablet Take 125 mcg by mouth daily. Monday through Friday only      . simvastatin (ZOCOR) 20 MG tablet Take 20 mg by mouth at bedtime.        Exam:  BP 118/76  Pulse 70  Temp(Src) 97.2 F (36.2 C) (Temporal)  Resp 16  Ht 5\' 1"  (1.549 m)  Wt 193 lb (87.544 kg)  BMI 36.47 kg/m2  General: Alert, morbidly obese [place] female, in no  distress  Skin: Warm and dry without rash or infection.  Breasts: No skin changes or masses  HEENT: No palpable masses or thyromegaly. Sclera nonicteric. Pupils equal round and reactive. Oropharynx clear.Healed thyroidectomy scar without mass  Lymph nodes: No cervical, supraclavicular, or inguinal nodes palpable.  Lungs: Breath sounds clear and equal without increased work of breathing  Cardiovascular: Regular rate and rhythm without murmur. No JVD or edema. Peripheral pulses intact.  Abdomen: Nondistended. Soft and nontender. No masses palpable. No organomegaly. No palpable hernias.In the mid epigastrium is an approximately 2 cm rounded rubbery deep subcutaneous mass  Extremities: No edema or joint swelling or deformity. No chronic venous stasis changes.  Neurologic: Alert and fully oriented. Gait normal.   Assessment and plan: Morbid obesity with comorbidities of hypertension and diabetes mellitus oral agent controlled. Reviewed the consent form and all her questions about the surgery were answered. Due to her right upper quadrant discomfort going to get a preoperative ultrasound to rule out cholelithiasis and should this show gallstones I would plan cholecystectomy at the time of her lap band surgery this was discussed with her.

## 2011-06-13 NOTE — Interval H&P Note (Signed)
History and Physical Interval Note:  06/13/2011 9:04 AM  Cathy Howell  has presented today for surgery, with the diagnosis of morbid obesity   The various methods of treatment have been discussed with the patient and family. After consideration of risks, benefits and other options for treatment, the patient has consented to  Procedure(s) (LRB): LAPAROSCOPIC GASTRIC BANDING (N/A) as a surgical intervention .  The patients' history has been reviewed, patient examined, no change in status, stable for surgery.  I have reviewed the patients' chart and labs.  Questions were answered to the patient's satisfaction.     Muaaz Brau T

## 2011-06-13 NOTE — Anesthesia Postprocedure Evaluation (Signed)
  Anesthesia Post-op Note  Patient: Cathy Howell  Procedure(s) Performed: Procedure(s) (LRB): LAPAROSCOPIC GASTRIC BANDING (N/A) LAPAROSCOPIC REPAIR OF HIATAL HERNIA ()  Patient Location: PACU  Anesthesia Type: General  Level of Consciousness: awake and alert   Airway and Oxygen Therapy: Patient Spontanous Breathing  Post-op Pain: mild  Post-op Assessment: Post-op Vital signs reviewed, Patient's Cardiovascular Status Stable, Respiratory Function Stable, Patent Airway and No signs of Nausea or vomiting  Post-op Vital Signs: stable  Complications: No apparent anesthesia complications

## 2011-06-20 ENCOUNTER — Encounter (HOSPITAL_COMMUNITY): Payer: Self-pay | Admitting: General Surgery

## 2011-06-28 ENCOUNTER — Encounter: Payer: 59 | Attending: Family Medicine | Admitting: *Deleted

## 2011-06-28 DIAGNOSIS — Z713 Dietary counseling and surveillance: Secondary | ICD-10-CM | POA: Insufficient documentation

## 2011-06-28 DIAGNOSIS — Z01818 Encounter for other preprocedural examination: Secondary | ICD-10-CM | POA: Insufficient documentation

## 2011-06-30 ENCOUNTER — Telehealth (INDEPENDENT_AMBULATORY_CARE_PROVIDER_SITE_OTHER): Payer: Self-pay | Admitting: General Surgery

## 2011-06-30 ENCOUNTER — Encounter: Payer: Self-pay | Admitting: *Deleted

## 2011-06-30 ENCOUNTER — Encounter (INDEPENDENT_AMBULATORY_CARE_PROVIDER_SITE_OTHER): Payer: Self-pay | Admitting: General Surgery

## 2011-06-30 ENCOUNTER — Ambulatory Visit (INDEPENDENT_AMBULATORY_CARE_PROVIDER_SITE_OTHER): Payer: Commercial Managed Care - PPO | Admitting: General Surgery

## 2011-06-30 DIAGNOSIS — Z09 Encounter for follow-up examination after completed treatment for conditions other than malignant neoplasm: Secondary | ICD-10-CM

## 2011-06-30 NOTE — Progress Notes (Addendum)
  Bariatric Class:  Appt start time: 1600 end time:  1700.  2 Week Post-Operative Nutrition Class  Patient was seen on 06/28/11 for Post-Operative Nutrition education at [facility].   Surgery date: 06/13/11 Surgery type: LAGB Start wt @ [facility]: 196.7 lbs  Weight today: 185.5 lbs  Weight change: 11.2 lbs Total weight lost: 11.2 lbs BMI: 35.0 kg/m^2  TANITA  BODY COMP RESULTS  06/28/11    %Fat 46.8%    FM (lbs) 87.0    FFM (lbs) 98.5    TBW (lbs) 72.0   The following the learning objective met the patient during this course:   Identifies Phase 3A (Soft, High Proteins) Dietary Goals and will begin from 2 weeks post-operatively to 2 months post-operatively  Identifies appropriate sources of fluids and proteins   States protein recommendations and appropriate sources post-operatively  Identifies the need for appropriate texture modifications, mastication, and bite sizes when consuming solids  Identifies appropriate multivitamin and calcium sources post-operatively  Describes the need for physical activity post-operatively and will follow MD recommendations  States when to call healthcare provider regarding medication questions or post-operative complications  Handouts given during class include:  Phase 3A: Soft, High Protein Diet Handout  Band Fill Guidelines Handout  Samples dispensed:  Celebrate Vitamins (2 ea) MVI Mandarin Orange: Lot # [id number]; Exp: 07/14  (1 ea) MVI Grape: Lot # [id number]; Exp: 01/05  (1 ea) MVI Pineapple-Strawberry: Lot # [id number]; Exp: 09/14 (2 ea) Iron + C 18 mg: Lot #  [id number]; Exp: 03/15 (2 ea) Iron + C 30 mg: Lot #  [id number]; Exp: 07/14  Follow-Up Plan: Patient will follow-up at [facility] in 6 weeks for 2 months post-op nutrition visit for diet advancement per MD.

## 2011-06-30 NOTE — Progress Notes (Signed)
History: This returns for her initial followup 3 weeks following lap band. She is doing well without concerns at home. She does experience restriction with solid food she eats too fast. She has lost approximately 10 pounds since surgery. No vomiting with liquids or eating slowly.  Exam: Appears well. Abdomen soft and nontender. Slight bruising around the port site but otherwise healing well.  Plan: Doing well following lap band without complication. Return in 3-4 weeks to assess for initial fill.

## 2011-06-30 NOTE — Patient Instructions (Signed)
Patient to follow Phase 3A-Soft, High Protein Diet and follow-up at [facility] in 6 weeks for 2 months post-op nutrition visit for diet advancement. 

## 2011-07-04 NOTE — Telephone Encounter (Signed)
Patient lap band appointment for 07/14/11 @ 10:30 w/Dr. Johna Sheriff

## 2011-07-14 ENCOUNTER — Ambulatory Visit (INDEPENDENT_AMBULATORY_CARE_PROVIDER_SITE_OTHER): Payer: Commercial Managed Care - PPO | Admitting: General Surgery

## 2011-07-14 NOTE — Progress Notes (Signed)
History: Patient returns for followup of lap band placed June 13, 2011. She has not yet had her first fill. She feels she's having some increased appetite and easy restriction although she still has some occasional dry me to get stuck. No nausea vomiting or reflux.  Exam: BP 126/80  Pulse 71  Temp(Src) 98.5 F (36.9 C) (Temporal)  Resp 16  Ht 5\' 1"  (1.549 m)  Wt 179 lb 12.8 oz (81.557 kg)  BMI 33.97 kg/m2  Total weight lost 13 pounds General: Appears well. Abdomen: Soft nontender port site looks fine.  Assessment and plan: Doing well following lap band. We went ahead with an initial fill today of 1.5 cc she was able to tolerate fluids well. She will return in 4 weeks.

## 2011-08-15 ENCOUNTER — Encounter: Payer: 59 | Attending: Family Medicine | Admitting: *Deleted

## 2011-08-15 ENCOUNTER — Encounter: Payer: Self-pay | Admitting: *Deleted

## 2011-08-15 DIAGNOSIS — Z01818 Encounter for other preprocedural examination: Secondary | ICD-10-CM | POA: Insufficient documentation

## 2011-08-15 DIAGNOSIS — Z713 Dietary counseling and surveillance: Secondary | ICD-10-CM | POA: Insufficient documentation

## 2011-08-15 NOTE — Patient Instructions (Addendum)
Goals:  Follow Phase 3B: High Protein + Non-Starchy Vegetables  Eat 3-6 small meals/snacks, every 3-5 hrs  Increase lean protein foods to meet 60-80g goal  Increase fluid intake to 64oz +  Avoid drinking 15 minutes before, during and 30 minutes after eating  Aim for >30 min of physical activity daily 

## 2011-08-15 NOTE — Progress Notes (Signed)
Follow-up visit:  8 Weeks Post-Operative LAGB Surgery  Medical Nutrition Therapy:  Appt start time: 0830 end time:  0900.  Primary concerns today: Post-operative Bariatric Surgery Nutrition Management. Cathy Howell is here for 2 mo post-op visit and reports no problems. Advancing to Stage 3B.   Surgery date: 06/13/11 Surgery type: LAGB Start wt @ [facility]: 196.7 lbs  Weight today: 172.0 lbs  Weight change: 13.5 lbs Total weight lost: 24.7 lbs  TANITA  BODY COMP RESULTS  06/28/11 08/15/11    BMI (kg/m^2) 35.0 32.5    %Fat 46.8% 44.9%    FM (lbs) 87.0 77.0    FFM (lbs) 98.5 95.0    TBW (lbs) 72.0 69.5   24-hr recall: B (AM): Atkins shake  Snk (AM): Dannon Diabetic Control Light & Fit yogurt (4g CHO, 14g pro - 4 oz)  L (PM): Chicken (2 oz), raw green beans Snk (PM): Yogurt or cottage cheese (lite) or Babybel  D (PM): 1/3 omlette w/ diced ham Snk (PM): Atkins shake or SF popsicle  Fluid intake: Pro shakes (22 oz), ice chips, water, unsweet tea/diet snapple = >64 oz Estimated total protein intake: 75-85g  Medications: See medication list. MD has d/c'd lisinopril and the 125 mg dose of levothyroxine; replaced with HCTZ.  Also not taking Zocor at this time per MD. Supplementation: Taking regularly without problems  CBG:  Checks 2-3 times/week @ varied times Recent FBG: 88, 78, 89, 108 mg   Using straws: No Drinking while eating: No Hair loss: No Carbonated beverages: No N/V/D/C: Some constipation with iron supplementation; Takes miralax prn Last Lap-Band fill:  07/14/11 - Reports she is on the border of yellow/green zone  Recent physical activity:  Walks dog daily and reports 7-9 miles while working  Progress Towards Goal(s):  In progress.  Nutritional Diagnosis:  [place]-3.3 Overweight/obesity related to recent LAGB surgery as evidenced by patient following post-op nutritional guidelines for continued weight loss.    Intervention:  Nutrition education.  Handouts given during class  include:  Follow Phase 3B: High Protein + Non-Starchy Vegetables  Monitoring/Evaluation:  Dietary intake, exercise, lap band fills, and body weight. Follow up in 1 month for 3 month post-op visit.

## 2011-08-18 ENCOUNTER — Encounter (INDEPENDENT_AMBULATORY_CARE_PROVIDER_SITE_OTHER): Payer: Commercial Managed Care - PPO | Admitting: General Surgery

## 2011-08-24 ENCOUNTER — Other Ambulatory Visit (HOSPITAL_COMMUNITY): Payer: Self-pay | Admitting: Obstetrics and Gynecology

## 2011-08-24 DIAGNOSIS — Z1231 Encounter for screening mammogram for malignant neoplasm of breast: Secondary | ICD-10-CM

## 2011-09-06 ENCOUNTER — Encounter (INDEPENDENT_AMBULATORY_CARE_PROVIDER_SITE_OTHER): Payer: Self-pay | Admitting: General Surgery

## 2011-09-06 ENCOUNTER — Ambulatory Visit (INDEPENDENT_AMBULATORY_CARE_PROVIDER_SITE_OTHER): Payer: Commercial Managed Care - PPO | Admitting: General Surgery

## 2011-09-06 NOTE — Progress Notes (Signed)
Chief complaint: Followup lap band  History: Patient returns for followup of lap band placed in June 13, 2011. She had her initial fill 6 weeks ago. She has lost 13 pounds since that time. She has still very significant restriction not being able to eat dry meats and having some difficulty with solid food early in the day but tolerating it later the day. She fills up at about 1-1-1/2 cups of food. She tries to eat too fast she had occasional regurgitation.  Exam: BP 128/86  Pulse 74  Temp 97.3 F (36.3 C) (Temporal)  Resp 14  Ht 5\' 1"  (1.549 m)  Wt 166 lb 6 oz (75.467 kg)  BMI 31.44 kg/m2 Total weight loss 26 pounds General: Appears well Abdomen: Incisions and the port site looked fine  Assessment and plan: Doing well follow lap band with excellent weight loss. She still has very adequate restriction and we did not do a fill today. We discussed diet and exercise strategies and she will return in 6 weeks.

## 2011-09-15 ENCOUNTER — Encounter: Payer: 59 | Attending: Family Medicine | Admitting: *Deleted

## 2011-09-15 ENCOUNTER — Encounter: Payer: Self-pay | Admitting: *Deleted

## 2011-09-15 DIAGNOSIS — Z713 Dietary counseling and surveillance: Secondary | ICD-10-CM | POA: Insufficient documentation

## 2011-09-15 DIAGNOSIS — Z01818 Encounter for other preprocedural examination: Secondary | ICD-10-CM | POA: Insufficient documentation

## 2011-09-15 NOTE — Patient Instructions (Addendum)
Goals:  Follow Phase 3B: High Protein + Non-Starchy Vegetables  Eat 3-6 small meals/snacks, every 3-5 hrs  Continue intake of lean protein foods to meet 60-80g goal  Continue fluid intake of 64oz +  Add 15 grams of carbohydrate (fruit, whole grain, starchy vegetable) with meals  Avoid drinking 15 minutes before, during and 30 minutes after eating  Aim for >30 min of physical activity daily   

## 2011-09-15 NOTE — Progress Notes (Addendum)
Follow-up visit:  12 Weeks Post-Operative LAGB Surgery  Medical Nutrition Therapy:  Appt start time: 0930  End time: 1000.  Primary concerns today: Post-operative Bariatric Surgery Nutrition Management. Lynnae Sandhoff is here for 2 mo post-op visit and reports a "thick liquid" that rises to the back of her throat when eating. States she just spits it out. Has tried "bites" of potato with sour cream and consumes fruit. Discussed how these may slow weight loss. Voiced understanding, but my belief is she will likely continue this behavior.  Advancing to Phase 3B.   Surgery date: 06/13/11 Surgery type: LAGB Start wt @ [facility]: 196.7 lbs  Weight today: 166.5 lbs  Weight change: 5.5 lbs Total weight lost: 30.2 lbs  Weight goal: 120-140 lbs % goal met: 39-53%  TANITA  BODY COMP RESULTS  06/28/11 08/15/11 09/14/11    BMI (kg/m^2) 35.0 32.5 31.5    %Fat 46.8% 44.9% 43.8%    FM (lbs) 87.0 77.0 73.0    FFM (lbs) 98.5 95.0 93.5    TBW (lbs) 72.0 69.5 68.5   24-hr recall: B (AM): Bratwurst ("boiled to removed fat") with curry and ketchup Snk (AM): Watermelon or cheese OR 1 Atkins shake L (PM): Hamburger with salsa and cheese (no bun) Snk (PM): Atkins shake  D (PM): Egg salad OR spinach salad with chicken, feta, and mozzarella cheese Snk (PM): Atkins shake (if has not had one yet) or SF popsicle  Fluid intake:  Pro shakes (11-22 oz), water, unsweet tea/diet snapple = > 64 oz Estimated total protein intake: 75-85g  Medications: See medication list. Has added Citrucel to help mild constipation.  Supplementation: Taking regularly without problems  CBG:  No longer checking; reports she sees [place] in pharmacy for prediabetes management Recent FBG: None; states "I just had it checked at my doctor and it's fine"  Using straws: No Drinking while eating: No Hair loss: No Carbonated beverages: No N/V/D/C: Mild constipation continues; Regurgitation with bread and rice. Last Lap-Band fill:  Only 1 fill since  surgery (07/14/11) - She feels closer to the green zone, though no additional fill yet. Reports surgeon said he may put more fluid in as her weight loss continues.  Recent physical activity:  Walks dog daily and reports walking 7-9 miles while working (wears pedometer)  Progress Towards Goal(s):  In progress.  Nutritional Diagnosis:  [place]-3.3 Overweight/obesity related to recent LAGB surgery as evidenced by patient attempting to follow post-op nutritional guidelines for continued weight loss.    Intervention:  Nutrition education.  Monitoring/Evaluation:  Dietary intake, exercise, lap band fills, and body weight. Follow up in 3 month for 6 month post-op visit.

## 2011-09-16 ENCOUNTER — Ambulatory Visit (HOSPITAL_COMMUNITY): Payer: 59

## 2011-10-05 ENCOUNTER — Ambulatory Visit (HOSPITAL_COMMUNITY): Payer: 59

## 2011-10-10 ENCOUNTER — Ambulatory Visit (HOSPITAL_COMMUNITY)
Admission: RE | Admit: 2011-10-10 | Discharge: 2011-10-10 | Disposition: A | Discharge status: Home / Self Care | Payer: 59 | Source: Ambulatory Visit | Attending: Obstetrics and Gynecology | Admitting: Obstetrics and Gynecology

## 2011-10-10 DIAGNOSIS — Z1231 Encounter for screening mammogram for malignant neoplasm of breast: Secondary | ICD-10-CM | POA: Insufficient documentation

## 2011-10-14 ENCOUNTER — Ambulatory Visit (INDEPENDENT_AMBULATORY_CARE_PROVIDER_SITE_OTHER): Payer: Commercial Managed Care - PPO | Admitting: General Surgery

## 2011-10-14 ENCOUNTER — Encounter (INDEPENDENT_AMBULATORY_CARE_PROVIDER_SITE_OTHER): Payer: Self-pay | Admitting: General Surgery

## 2011-10-14 NOTE — Progress Notes (Signed)
Patient returns for followup of lap band placed 06/13/2011. We did not do a fill at her last visit she seemed well restricted and good weight loss. She now feels she is able to eat more. She does occasionally get some fluid coming up in her throat after eating but no vomiting or regurgitation.  Exam: BP 122/80  Pulse 92  Temp 97.8 F (36.6 C) (Temporal)  Resp 12  Ht 5\' 1"  (1.549 m)  Wt 163 lb 12.8 oz (74.299 kg)  BMI 30.95 kg/m2 Total weight loss 30.7 pounds, 2.8 pounds since last visit  General: Appears well Abdomen: Soft nontender port site looks fine.  Assessment and plan: Doing well following lap band with very good weight loss. We elected to go ahead with a fill today and at at 0.5 cc. She was able to tolerate water well. Return in 6 weeks

## 2011-10-14 NOTE — Patient Instructions (Signed)
Liquid diet only over the weekend. Begin solid diet Monday concentrating on small bites and eating very slowly.

## 2011-10-20 ENCOUNTER — Encounter: Payer: Self-pay | Admitting: *Deleted

## 2011-10-20 NOTE — Progress Notes (Signed)
Cathy Howell is here for repeat Tanita scan. Reports she had a fill last week and cannot keep many things down. Doesn't want to return to MD yet; trying to make it work. Reports she is losing hair too.   TANITA BODY COMP RESULTS   06/28/11  08/15/11  09/14/11  10/20/11  BMI (kg/m^2)  35.0  32.5  31.5  30.9  %Fat  46.8%  44.9%  43.8%  42.9%  FM (lbs)  87.0  77.0  73.0  70.0  FFM (lbs)  98.5  95.0  93.5  93.5  TBW (lbs)  72.0  69.5  68.5  68.5

## 2011-11-01 ENCOUNTER — Other Ambulatory Visit: Payer: Self-pay | Admitting: Obstetrics and Gynecology

## 2011-11-25 ENCOUNTER — Ambulatory Visit (INDEPENDENT_AMBULATORY_CARE_PROVIDER_SITE_OTHER): Payer: Commercial Managed Care - PPO | Admitting: General Surgery

## 2011-11-25 ENCOUNTER — Encounter (INDEPENDENT_AMBULATORY_CARE_PROVIDER_SITE_OTHER): Payer: Self-pay | Admitting: General Surgery

## 2011-11-25 NOTE — Progress Notes (Signed)
Chief complaint: Followup lap band  History: Patient returns for followup of lap band placed 06/13/2011. We did until about 6 weeks ago. Since then she is filling up with an appropriate amount of food, approximately one cup. She is having no nausea or vomiting or obstructive symptoms. She has been exercising regularly. She has developed a tender area of swelling over her left Achilles tendon.  Exam: BP 128/70  Pulse 60  Temp 97.4 F (36.3 C) (Temporal)  Resp 16  Ht 5' 0.5" (1.537 m)  Wt 161 lb 9.6 oz (73.301 kg)  BMI 31.04 kg/m2 Weight lost 2 pounds since last visit. Total weight loss 31 pounds. General: Appears well Extremities: There is a 2-3 cm area of tender swelling over the Achilles tendon just above the ankle on the left.  Assessment and plan: Doing very well with the LAP-BAND was slow progressive weight loss and appropriate restriction. We did not do a fill today.  I discussed with her and she may have some Achilles tendinitis and needs to be careful about overexertion with her left ankle and I recommended that she have orthopedist echo with this.  Return 2 months

## 2011-11-28 ENCOUNTER — Ambulatory Visit (INDEPENDENT_AMBULATORY_CARE_PROVIDER_SITE_OTHER): Payer: Self-pay | Admitting: Family Medicine

## 2011-11-28 DIAGNOSIS — I10 Essential (primary) hypertension: Secondary | ICD-10-CM

## 2011-11-28 NOTE — Progress Notes (Signed)
Patient presents today for 3 month DM follow-up as part of the employer-sponsored Link to Wellness program. Medications and glucose readings have been reviewed. I have also discussed with patient lifestyle interventions such as healthy eating choices and physical activity. Details of this visit can be found in Caretracker documenting program available through [facility] ([facility]). Patient has set a series of personal goals and will follow-up in 3 months for further review of DM.  

## 2011-12-14 ENCOUNTER — Encounter: Payer: Self-pay | Admitting: *Deleted

## 2011-12-15 ENCOUNTER — Encounter: Payer: Self-pay | Admitting: *Deleted

## 2011-12-15 ENCOUNTER — Encounter: Payer: Self-pay | Admitting: Family Medicine

## 2011-12-15 ENCOUNTER — Encounter: Payer: 59 | Attending: General Surgery | Admitting: *Deleted

## 2011-12-15 DIAGNOSIS — Z713 Dietary counseling and surveillance: Secondary | ICD-10-CM | POA: Insufficient documentation

## 2011-12-15 DIAGNOSIS — Z09 Encounter for follow-up examination after completed treatment for conditions other than malignant neoplasm: Secondary | ICD-10-CM | POA: Insufficient documentation

## 2011-12-15 DIAGNOSIS — Z9884 Bariatric surgery status: Secondary | ICD-10-CM | POA: Insufficient documentation

## 2011-12-15 NOTE — Assessment & Plan Note (Signed)
Reviewed under link to wellness

## 2011-12-15 NOTE — Assessment & Plan Note (Signed)
Link to wellness reviewed.

## 2011-12-15 NOTE — Progress Notes (Signed)
  Follow-up visit:  6 Months Post-Operative LAGB Surgery  Medical Nutrition Therapy:  Appt start time:  1230   End time: 1300.  Primary concerns today: Post-operative Bariatric Surgery Nutrition Management. Cathy Howell is here for f/u and reports the "thick liquid" is resolving.  Discovered that certain foods cause it to worsen (like chicken) so she avoids these. that rises to the back of her throat when eating. States she just spits it out. Continues to consume fruit and now eating pecan ice cream with granola/nuts (~1 cup) 2 times/week. Discussed how these may slow weight loss.   Surgery date: 06/13/11 Surgery type: LAGB Start wt @ [facility]: 196.7 lbs  Weight today: 166.5 lbs  Weight change: 5.5 lbs Total weight lost: 30.2 lbs  Weight goal: 120-140 lbs % goal met: 39-53%  TANITA BODY COMP RESULTS   06/28/11  08/15/11  09/14/11  10/20/11 12/14/11  BMI (kg/m^2)  35.0  32.5  31.5  30.9 30.5  %Fat  46.8%  44.9%  43.8%  42.9% 44.0%  FM (lbs)  87.0  77.0  73.0  70.0 71.0  FFM (lbs)  98.5  95.0  93.5  93.5 90.5  TBW (lbs)  72.0  69.5  68.5  68.5 66.0   24-hr recall: On night shift now - eating every 3-4 hrs. Finishes 2nd shift on 11/15 and will get back to regular schedule. B (AM): Decaf coffee and supplements  Snk (AM): Fage yogurt w/ almonds, granola, and 2 Tbsp frozen fruit (~25g) L (PM): 2 oz protein Snk (PM): [employer] pro powder w/ skim milk (25g)  D (PM): Spinach salad or french onion soup Snk (PM): Atkins shake (if has not had one yet) or SF popsicle  Fluid intake:  Pro shakes (22 oz), water, unsweet tea/diet snapple = > 64 oz Estimated total protein intake: 75-85g  Medications: See medication list. No longer taking zocor.  Supplementation: Taking regularly without problems  CBG:  No longer checking; reports she sees [place] in pharmacy for prediabetes management Recent FBG: None (per MD)  Using straws: No Drinking while eating: No Hair loss: Yes; resolving with biotin Carbonated  beverages: No N/V/D/C: Constipation resolved Last Lap-Band fill:  Only 1 fill since surgery (07/14/11) - She feels closer to the green zone, though no additional fill yet. Reports surgeon said he may put more fluid in as her weight loss continues.  Recent physical activity:  Walks dog daily and reports walking 7-9 miles while working (wears pedometer); does ab roller and jump roping at home. States interest in BELT program.   Progress Towards Goal(s):  In progress.  Nutritional Diagnosis:  [place]-3.3 Overweight/obesity related to recent LAGB surgery as evidenced by patient attempting to follow post-op nutritional guidelines for continued weight loss.    Handouts given at visit include:   BELT Program flyer  Intervention:  Nutrition education.  Monitoring/Evaluation:  Dietary intake, exercise, lap band fills, and body weight. Follow up in 2 month for 14 month post-op visit (per pt request to return before EOY).

## 2011-12-15 NOTE — Patient Instructions (Addendum)
Goals:  Continue to follow Phase 3B: High Protein + Non-Starchy Vegetables  Eat 3-6 small meals/snacks, every 3-5 hrs  Increase lean protein foods to meet 60-80g goal  Increase fluid intake to 64oz +  Add 15 grams (MAX) of carbohydrate (fruit, whole grain, starchy vegetable) with meals  Avoid drinking 15 minutes before, during and 30 minutes after eating  Aim for >30 min of physical activity daily   

## 2011-12-15 NOTE — Progress Notes (Signed)
Patient ID: Cathy Howell, female   DOB: 27-Sep-1955, 56 y.o.   MRN: [id number] Reviewed and agree with this documentation and management.

## 2011-12-16 ENCOUNTER — Ambulatory Visit: Payer: 59 | Admitting: *Deleted

## 2012-01-18 ENCOUNTER — Telehealth (INDEPENDENT_AMBULATORY_CARE_PROVIDER_SITE_OTHER): Payer: Self-pay

## 2012-01-18 NOTE — Telephone Encounter (Signed)
Left voice message for patient to call our office regarding her appointment for 01/27/12 w/Dr. Johna Sheriff.  Appointment time need's to be moved to 9:15 on 01/27/12 w/Dr. Johna Sheriff or r/s to a different day.

## 2012-01-24 ENCOUNTER — Telehealth (INDEPENDENT_AMBULATORY_CARE_PROVIDER_SITE_OTHER): Payer: Self-pay

## 2012-01-24 NOTE — Telephone Encounter (Signed)
Left patient a message regarding appointment for 01/27/12 (patient appt need's to be rescheduled due to change in clinic time)

## 2012-01-27 ENCOUNTER — Ambulatory Visit (INDEPENDENT_AMBULATORY_CARE_PROVIDER_SITE_OTHER): Payer: Commercial Managed Care - PPO | Admitting: General Surgery

## 2012-01-27 ENCOUNTER — Encounter (INDEPENDENT_AMBULATORY_CARE_PROVIDER_SITE_OTHER): Payer: Self-pay | Admitting: General Surgery

## 2012-01-27 ENCOUNTER — Encounter (INDEPENDENT_AMBULATORY_CARE_PROVIDER_SITE_OTHER): Payer: Commercial Managed Care - PPO | Admitting: General Surgery

## 2012-01-27 DIAGNOSIS — Z4651 Encounter for fitting and adjustment of gastric lap band: Secondary | ICD-10-CM

## 2012-01-27 NOTE — Progress Notes (Signed)
Chief complaint: Followup lap band  History: The patient returns for follow up of lab and placed 06/13/2011. Since her last visit she feels that she is able to keep more and is feeling hungry her. She does not have any significant vomiting. She had been drinking some smooth ease with ice cream but realized this was wrong and that stopped this on around.  Past Medical History  Diagnosis Date  . Arthritis   . Leg pain   . Hypertension   . Thyroid disease   . Diabetes mellitus   . Hyperlipidemia   . Hypothyroidism   . Blood transfusion     hip surgery  . Anxiety   . Depression    Current Outpatient Prescriptions  Medication Sig Dispense Refill  . citalopram (CELEXA) 40 MG tablet Take 40 mg by mouth at bedtime.       Marland Kitchen geriatric multivitamins-minerals (ELDERTONIC/GEVRABON) ELIX Take 15 mLs by mouth daily.      Marland Kitchen levothyroxine (SYNTHROID, LEVOTHROID) 100 MCG tablet Take 100 mcg by mouth daily.       Exam: BP 134/98  Pulse 83  Temp 98.4 F (36.9 C) (Temporal)  Ht 5\' 1"  (1.549 m)  Wt 163 lb 9.6 oz (74.208 kg)  BMI 30.91 kg/m2  SpO2 98% Total weight loss 29 pounds, up two pounds from last visit General:appears well Abdomen: Soft and nontender. Port site looks fine.  Assessment and plan: With this information we elected to go ahead with a fill today. I had 0.5 cc bring her total up to 5.25 cc. We discussed diet and need to avoid pork liquids which she understands. She's having some difficulty exercising due to ongoing Achilles tendinitis. She is to return in 2 months.

## 2012-01-27 NOTE — Patient Instructions (Addendum)
No caloric liquids other than protein shakes

## 2012-01-30 ENCOUNTER — Encounter (INDEPENDENT_AMBULATORY_CARE_PROVIDER_SITE_OTHER): Payer: Self-pay | Admitting: General Surgery

## 2012-01-30 ENCOUNTER — Encounter (INDEPENDENT_AMBULATORY_CARE_PROVIDER_SITE_OTHER): Payer: Commercial Managed Care - PPO | Admitting: General Surgery

## 2012-01-30 ENCOUNTER — Ambulatory Visit (INDEPENDENT_AMBULATORY_CARE_PROVIDER_SITE_OTHER): Payer: Commercial Managed Care - PPO | Admitting: General Surgery

## 2012-01-30 NOTE — Progress Notes (Signed)
Chief complaint: Vomiting post band fill  History: Patient underwent a band fill 3 days ago and has been unable to tolerate even fluids.  Exam: BP 115/60  Pulse 74  Temp 97.9 F (36.6 C) (Temporal)  Resp 16  Ht 5\' 1"  (1.549 m)  Wt 154 lb 3.2 oz (69.945 kg)  BMI 29.14 kg/m2 Weight loss 9 pounds since last visit and total 39 pounds General: Does not appear ill Abdomen: Soft and nontender  Assessment and plan: This information we removed the 0.5 cc field and performed the other day. She was able to tolerate fluids well. We will see her back in about 3 weeks and consider a 0.25 cc fill.

## 2012-02-16 ENCOUNTER — Encounter: Payer: Self-pay | Admitting: *Deleted

## 2012-02-16 ENCOUNTER — Encounter: Payer: 59 | Attending: General Surgery | Admitting: *Deleted

## 2012-02-16 DIAGNOSIS — Z713 Dietary counseling and surveillance: Secondary | ICD-10-CM | POA: Insufficient documentation

## 2012-02-16 DIAGNOSIS — Z9884 Bariatric surgery status: Secondary | ICD-10-CM | POA: Insufficient documentation

## 2012-02-16 DIAGNOSIS — Z09 Encounter for follow-up examination after completed treatment for conditions other than malignant neoplasm: Secondary | ICD-10-CM | POA: Insufficient documentation

## 2012-02-16 NOTE — Patient Instructions (Addendum)
Goals:  Continue to follow Phase 3B: High Protein + Non-Starchy Vegetables  Eat 3-6 small meals/snacks, every 3-5 hrs  Increase lean protein foods to meet 60-80g goal  Increase fluid intake to 64oz +  Add 15 grams (MAX) of carbohydrate (fruit, whole grain, starchy vegetable) with meals  Avoid drinking 15 minutes before, during and 30 minutes after eating  Aim for >30 min of physical activity daily  *Follow up with Dr. Jamse Mead office for appointment on 02/20/12.

## 2012-02-16 NOTE — Progress Notes (Signed)
  Follow-up visit:  9 Months Post-Operative LAGB Surgery  Medical Nutrition Therapy:  Appt start time:  1230   End time: 1300.  Primary concerns today: Post-operative Bariatric Surgery Nutrition Management. Cathy Howell is here for f/u and reports she was recently in the red zone; had to have fluid removed on 01/30/12. Mucus continues to rise to back of throat after eating, so she will drink 1/2 bottle of water before meals to "flush" the mucus through, then she will eat.  This keeps the mucus down. Eats 1 pkt oatmeal with 1-2 spoons of nuts, granola, and a few raisins for breakfast. Has shakes for snacks. Relies on spinach for salads with a small amount of bacon and cheese. Can no longer tolerate chicken; it "feels like rocks". Shrimp and fish are still ok.   Surgery date: 06/13/11 Surgery type: LAGB Start wt @ [facility]: 196.7 lbs  Weight today: 157.5 lbs  Weight change: 9.0 lbs Total weight lost: 39.2 lbs Weight goal: 120-140 lbs % goal met: 51-69%  TANITA BODY COMP RESULTS   06/28/11  08/15/11  09/14/11  10/20/11 12/14/11 02/16/12  BMI (kg/m^2)  35.0  32.5  31.5  30.9 30.5 29.8  FM (lbs)  87.0  77.0  73.0  70.0 71.0 66.5  FFM (lbs)  98.5  95.0  93.5  93.5 90.5 91.0  TBW (lbs)  72.0  69.5  68.5  68.5 66.0 66.5   Fluid intake:  2-3 Pro shakes (22-33 oz), water, unsweet tea/diet snapple = > 64 oz Estimated total protein intake: 75-85g  Medications:  No changes reported Supplementation: Taking regularly without problems  CBG:  No longer checking; reports she sees [place] in pharmacy for prediabetes management Recent FBG: None (per MD)  Using straws: No Drinking while eating: No Hair loss: Resolving Carbonated beverages: No N/V/D/C: Only for one weekend when too much fluid in band; no longer tolerating chicken Last Lap-Band fill:  Fill of 0.5 cc on 01/27/12; removed on 01/30/12 d/t being in red zone.   Recent physical activity:  Walks dog daily and reports walking 7-9 miles while working (wears  pedometer) and hand weights; No BELT program d/t injury in lower back from ab roller.  Progress Towards Goal(s):  In progress.  Nutritional Diagnosis:  [place]-3.3 Overweight/obesity related to recent LAGB surgery as evidenced by patient attempting to follow post-op nutritional guidelines for continued weight loss.  Intervention:  Nutrition education.  Monitoring/Evaluation:  Dietary intake, exercise, lap band fills, and body weight. Follow up in 6 months or prn based on insurance coverage (per pt).

## 2012-03-09 ENCOUNTER — Encounter (INDEPENDENT_AMBULATORY_CARE_PROVIDER_SITE_OTHER): Payer: Commercial Managed Care - PPO | Admitting: General Surgery

## 2012-04-07 ENCOUNTER — Other Ambulatory Visit: Payer: Self-pay

## 2012-06-20 ENCOUNTER — Telehealth (INDEPENDENT_AMBULATORY_CARE_PROVIDER_SITE_OTHER): Payer: Self-pay | Admitting: General Surgery

## 2012-06-20 NOTE — Telephone Encounter (Signed)
Called to see if patient could come in sooner she will be here at 1:00 on 5/1

## 2012-06-21 ENCOUNTER — Ambulatory Visit (INDEPENDENT_AMBULATORY_CARE_PROVIDER_SITE_OTHER): Payer: Commercial Managed Care - PPO | Admitting: Physician Assistant

## 2012-06-21 ENCOUNTER — Encounter (INDEPENDENT_AMBULATORY_CARE_PROVIDER_SITE_OTHER): Payer: Self-pay

## 2012-06-21 VITALS — BP 128/80 | HR 103 | Temp 97.9°F | Resp 18 | Ht 61.0 in | Wt 168.4 lb

## 2012-06-21 DIAGNOSIS — Z4651 Encounter for fitting and adjustment of gastric lap band: Secondary | ICD-10-CM

## 2012-06-21 NOTE — Patient Instructions (Signed)
Take clear liquids tonight. Thin protein shakes are ok to start tomorrow morning. Slowly advance your diet thereafter. Call us if you have persistent vomiting or regurgitation, night cough or reflux symptoms. Return as scheduled or sooner if you notice no changes in hunger/portion sizes.  

## 2012-06-21 NOTE — Progress Notes (Signed)
  HISTORY: Cathy Howell is a 57 y.o.female who received an AP-Standard lap-band in April 2013 by Dr. Johna Sheriff. She comes in with 14 lbs weight gain since fluid was removed in December. She has no further obstructive symptoms but she does complain of hunger and larger portion sizes. She would like a fill today to get her symptoms and weight under control.  VITAL SIGNS: Filed Vitals:   06/21/12 1308  BP: 128/80  Pulse: 103  Temp: 97.9 F (36.6 C)  Resp: 18    PHYSICAL EXAM: Physical exam reveals a very well-appearing 57 y.o.female in no apparent distress Neurologic: Awake, alert, oriented Psych: Bright affect, conversant Respiratory: Breathing even and unlabored. No stridor or wheezing Abdomen: Soft, nontender, nondistended to palpation. Incisions well-healed. No incisional hernias. Port easily palpated. Extremities: Atraumatic, good range of motion.  ASSESMENT: 57 y.o.  female  s/p AP-Standard lap-band.   PLAN: The patient's port was accessed with a 20G Huber needle without difficulty. Clear fluid was aspirated and 0.3 mL saline was added to the port to give a total predicted volume of 5.05 mL. The patient was able to swallow water without difficulty following the procedure and was instructed to take clear liquids for the next 24-48 hours and advance slowly as tolerated.

## 2012-06-22 ENCOUNTER — Encounter (INDEPENDENT_AMBULATORY_CARE_PROVIDER_SITE_OTHER): Payer: Commercial Managed Care - PPO | Admitting: General Surgery

## 2012-07-05 ENCOUNTER — Encounter: Payer: Self-pay | Admitting: Physician Assistant

## 2012-07-05 ENCOUNTER — Ambulatory Visit (INDEPENDENT_AMBULATORY_CARE_PROVIDER_SITE_OTHER): Payer: 59 | Admitting: Physician Assistant

## 2012-07-05 VITALS — BP 114/80 | HR 80 | Temp 97.0°F | Resp 18 | Ht 60.0 in | Wt 163.0 lb

## 2012-07-05 DIAGNOSIS — E039 Hypothyroidism, unspecified: Secondary | ICD-10-CM | POA: Insufficient documentation

## 2012-07-05 DIAGNOSIS — R5381 Other malaise: Secondary | ICD-10-CM

## 2012-07-05 DIAGNOSIS — R531 Weakness: Secondary | ICD-10-CM

## 2012-07-05 DIAGNOSIS — Z9884 Bariatric surgery status: Secondary | ICD-10-CM

## 2012-07-05 LAB — CBC WITH DIFFERENTIAL/PLATELET
Eosinophils Absolute: 0.1 10*3/uL (ref 0.0–0.7)
Eosinophils Relative: 1 % (ref 0–5)
Hemoglobin: 13.3 g/dL (ref 12.0–15.0)
Lymphs Abs: 2 10*3/uL (ref 0.7–4.0)
MCH: 28.7 pg (ref 26.0–34.0)
MCHC: 33.8 g/dL (ref 30.0–36.0)
MCV: 84.7 fL (ref 78.0–100.0)
Monocytes Relative: 7 % (ref 3–12)
RBC: 4.64 MIL/uL (ref 3.87–5.11)

## 2012-07-05 LAB — COMPLETE METABOLIC PANEL WITH GFR
BUN: 8 mg/dL (ref 6–23)
CO2: 26 mEq/L (ref 19–32)
Creat: 0.74 mg/dL (ref 0.50–1.10)
GFR, Est African American: >89 mL/min
GFR, Est Non African American: >89 mL/min
Glucose, Bld: 84 mg/dL (ref 70–99)
Total Bilirubin: 0.6 mg/dL (ref 0.3–1.2)

## 2012-07-05 LAB — TSH: TSH: 0.246 u[IU]/mL — ABNORMAL LOW (ref 0.350–4.500)

## 2012-07-05 LAB — URINALYSIS, ROUTINE W REFLEX MICROSCOPIC
Hgb urine dipstick: NEGATIVE
Ketones, ur: NEGATIVE mg/dL
Nitrite: NEGATIVE
Urobilinogen, UA: 0.2 mg/dL (ref 0.0–1.0)
pH: 7 (ref 5.0–8.0)

## 2012-07-05 NOTE — Progress Notes (Signed)
Patient ID: Cathy Howell MRN: [id number], DOB: 23-Feb-1955, 57 y.o. Date of Encounter: @DATE @  Chief Complaint:  Chief Complaint  Patient presents with  . not feeling well  c/o achy, headache x 2 days    HPI: 57 y.o. year old female  presents with complaints of feeling weak and "just not feeling herself" for past 2 days. Says she has had body aches, dull headache, scratchy throat, and some cough for past 2 days. No fever. No rash. Has seen no tics. No abdominal pain, vomiting, or diarrhea.  Also notes that she had lap band 06/13/11 and has lost a lot of weight-"wonders if some levels are off related to that."  Also, is on levothyroxine-levels have had to be adjusted since lap band. "Wonders if that is off again."  Works in [employer]. At [facility] (knows Dr. Ranell Patrick)  Past Medical History  Diagnosis Date  . Arthritis   . Leg pain   . Hypertension   . Diabetes mellitus   . Hyperlipidemia   . Blood transfusion     hip surgery  . Anxiety   . Depression   . Thyroid disease   . Hypothyroidism      Home Meds: See attached medication section for current medication list. Any medications entered into computer today will not appear on this note's list. The medications listed below were entered prior to today. Current Outpatient Prescriptions on File Prior to Visit  Medication Sig Dispense Refill  . BIOTIN PO Take by mouth daily.      . citalopram (CELEXA) 40 MG tablet Take 40 mg by mouth at bedtime.       Marland Kitchen geriatric multivitamins-minerals (ELDERTONIC/GEVRABON) ELIX Take 15 mLs by mouth daily.      Marland Kitchen levothyroxine (SYNTHROID, LEVOTHROID) 100 MCG tablet Take 100 mcg by mouth daily.       No current facility-administered medications on file prior to visit.    Allergies: No Known Allergies  History   Social History  . Marital Status: Married    Spouse Name: N/A    Number of Children: N/A  . Years of Education: N/A   Occupational History  . Not on file.   Social History Main Topics   . Smoking status: Former Smoker -- 1.00 packs/day for 15 years    Types: Cigarettes    Quit date: 02/22/1992  . Smokeless tobacco: Never Used  . Alcohol Use: No  . Drug Use: No  . Sexually Active: Not on file   Other Topics Concern  . Not on file   Social History Narrative  . No narrative on file    Family History  Problem Relation Age of Onset  . Cancer Maternal Grandfather     colon     Review of Systems:  See HPI for pertinent ROS. All other ROS negative.    Physical Exam: Blood pressure 114/80, pulse 80, temperature 97 F (36.1 C), temperature source Oral, resp. rate 18, height 5' (1.524 m), weight 163 lb (73.936 kg)., Body mass index is 31.83 kg/(m^2). General:WNWD WF.  Appears in no acute distress. Head: Normocephalic, atraumatic, eyes without discharge, sclera non-icteric, nares are without discharge. Bilateral auditory canals clear, TM's are without perforation, pearly grey and translucent with reflective cone of light bilaterally. Oral cavity moist, posterior pharynx without exudate, erythema, peritonsillar abscess, or post nasal drip.  Neck: Supple. No thyromegaly. No lymphadenopathy. Lungs: Clear bilaterally to auscultation without wheezes, rales, or rhonchi. Breathing is unlabored. Heart: RRR with S1 S2. No murmurs,  rubs, or gallops. Abdomen: Soft, non-tender, non-distended with normoactive bowel sounds. No hepatomegaly. No rebound/guarding. No obvious abdominal masses. Musculoskeletal:  Strength and tone normal for age. Extremities/Skin: Warm and dry. No clubbing or cyanosis. No edema. No rashes or suspicious lesions. Neuro: Alert and oriented X 3. Moves all extremities spontaneously. Gait is normal. CNII-XII grossly in tact. Psych:  Responds to questions appropriately with a normal affect.     ASSESSMENT AND PLAN:  57 y.o. year old female with  1. Weakness - CBC with Differential - COMPLETE METABOLIC PANEL WITH GFR - TSH - Lyme disease dna by pcr(borrelia  burg) - [place] spotted fvr abs pnl(IgG+IgM) - Urinalysis, Routine w reflex microscopic  2. Hx of laparoscopic gastric banding - COMPLETE METABOLIC PANEL WITH GFR  3. Hypothyroidism - TSH  4. Viral Respiratory Illness: She has muscle aches, headaches, scratchy throat, cough. Her symptoms may just be sec to viral illness but will check labs to R/O other causes.  Will check these labs. Will f/u with her once we have lab results then treat accordingly.    [address] [place], [place], [facility] 07/05/2012 12:49 PM

## 2012-07-05 NOTE — Addendum Note (Signed)
Addended by: WRAY, [place] on: 07/05/2012 01:14 PM   Modules accepted: Orders

## 2012-07-06 ENCOUNTER — Telehealth: Payer: Self-pay | Admitting: Family Medicine

## 2012-07-06 DIAGNOSIS — E039 Hypothyroidism, unspecified: Secondary | ICD-10-CM

## 2012-07-06 LAB — ROCKY MTN SPOTTED FVR ABS PNL(IGG+IGM)
RMSF IgG: 0.24 IV
RMSF IgM: 0.35 IV

## 2012-07-06 MED ORDER — AZITHROMYCIN 250 MG PO TABS: ORAL_TABLET | ORAL | Status: DC

## 2012-07-06 MED ORDER — LEVOTHYROXINE SODIUM 88 MCG PO TABS: 88.0000 ug | ORAL_TABLET | Freq: Every day | ORAL | Status: DC

## 2012-07-06 NOTE — Telephone Encounter (Signed)
Pt notified of lab results.  Medications sent to pharmacy.  Pt instructed to repeat TSH in 6 weeks  Order placed.

## 2012-07-06 NOTE — Telephone Encounter (Signed)
Message copied by Donne Anon on Fri Jul 06, 2012  4:45 PM ------      Message from: Allayne Butcher      Created: Fri Jul 06, 2012  4:26 PM       1- TSH came back slightly low-was borderline low at last check in December also--needs to adjust dose slightly. Currently on QD. Decrease to 88 mcg QD. Recheck lab in 6 weeks.      2- At OV she c/o "just not feeling herself for 2 days", tired, h/a. Also had some ST, congestion, etc.   CMET, UA, Lyme, RMSF all negative. CBC showed slightly elevated WBC and Neutrophils.       This is all c/w bacterial URI. Will teat with antibiotic.            Send in RX for Levothyroxine one po QD # 30/ one refill      Send in Rx for ZPack 250 mg  2 po QD Day 1. One PO QD Days 2-5.   Disp # 6 (one pack) / 0 refills      Order TSH future ------

## 2012-07-19 ENCOUNTER — Encounter (INDEPENDENT_AMBULATORY_CARE_PROVIDER_SITE_OTHER): Payer: Self-pay

## 2012-07-19 ENCOUNTER — Ambulatory Visit (INDEPENDENT_AMBULATORY_CARE_PROVIDER_SITE_OTHER): Payer: Commercial Managed Care - PPO | Admitting: Physician Assistant

## 2012-07-19 VITALS — BP 122/68 | HR 80 | Temp 98.4°F | Resp 18 | Ht 61.0 in | Wt 157.4 lb

## 2012-07-19 DIAGNOSIS — Z4651 Encounter for fitting and adjustment of gastric lap band: Secondary | ICD-10-CM

## 2012-07-19 NOTE — Progress Notes (Signed)
  HISTORY: Cathy Howell is a 57 y.o.female who received an AP-Standard lap-band in April 2013 by Dr. Johna Sheriff. She had a fill on 5/1 but about a week later she began having night cough and overall feeling poorly. It's hard to discern whether she had  URI prior to obstructive symptoms or the other way around, but she is now complaining of solid food dysphagia and is having difficulty taking her medications. She believes she needs some fluid removed.  VITAL SIGNS: Filed Vitals:   07/19/12 1508  BP: 122/68  Pulse: 80  Temp: 98.4 F (36.9 C)  Resp: 18    PHYSICAL EXAM: Physical exam reveals a very well-appearing 57 y.o.female in no apparent distress Neurologic: Awake, alert, oriented Psych: Bright affect, conversant Respiratory: Breathing even and unlabored. No stridor or wheezing Abdomen: Soft, nontender, nondistended to palpation. Incisions well-healed. No incisional hernias. Port easily palpated. Extremities: Atraumatic, good range of motion.  ASSESMENT: 57 y.o.  female  s/p AP-Standard lap-band.   PLAN: The patient's port was accessed with a 20G Huber needle without difficulty. Clear fluid was aspirated and 0.3 mL saline was removed from the port to give a total predicted volume of 4.75 mL. The patient was advised to concentrate on healthy food choices and to avoid slider foods high in fats and carbohydrates.

## 2012-07-19 NOTE — Patient Instructions (Signed)
Return in July as scheduled. Focus on good food choices as well as physical activity. Return sooner if you have an increase in hunger, portion sizes or weight. Return also for difficulty swallowing, night cough, reflux. Take over-the-counter Omeprazole 20mg  one tablet a day for two weeks.

## 2012-08-16 ENCOUNTER — Ambulatory Visit: Payer: 59 | Admitting: *Deleted

## 2012-08-23 ENCOUNTER — Encounter (INDEPENDENT_AMBULATORY_CARE_PROVIDER_SITE_OTHER): Payer: Commercial Managed Care - PPO

## 2012-09-07 ENCOUNTER — Other Ambulatory Visit (HOSPITAL_COMMUNITY): Payer: Self-pay | Admitting: Obstetrics and Gynecology

## 2012-09-07 DIAGNOSIS — Z1231 Encounter for screening mammogram for malignant neoplasm of breast: Secondary | ICD-10-CM

## 2012-09-10 ENCOUNTER — Ambulatory Visit: Payer: 59 | Admitting: *Deleted

## 2012-09-27 ENCOUNTER — Encounter (INDEPENDENT_AMBULATORY_CARE_PROVIDER_SITE_OTHER): Payer: Self-pay

## 2012-09-27 ENCOUNTER — Ambulatory Visit (INDEPENDENT_AMBULATORY_CARE_PROVIDER_SITE_OTHER): Payer: Commercial Managed Care - PPO | Admitting: Physician Assistant

## 2012-09-27 VITALS — BP 150/92 | HR 72 | Temp 97.6°F | Resp 16 | Ht 61.0 in | Wt 168.8 lb

## 2012-09-27 DIAGNOSIS — Z4651 Encounter for fitting and adjustment of gastric lap band: Secondary | ICD-10-CM

## 2012-09-27 NOTE — Patient Instructions (Signed)

## 2012-09-27 NOTE — Progress Notes (Signed)
  HISTORY: Cathy Howell is a 57 y.o.female who received an AP-Standard lap-band in April 2013 by Dr. Johna Sheriff. She had fluid removed for obstructive symptoms which have resolved. She now complains of increased hunger and portion sizes resulting in a 11 lb weight gain. She would like a fill today.  VITAL SIGNS: Filed Vitals:   09/27/12 0950  BP: 150/92  Pulse: 72  Temp: 97.6 F (36.4 C)  Resp: 16    PHYSICAL EXAM: Physical exam reveals a very well-appearing 57 y.o.female in no apparent distress Neurologic: Awake, alert, oriented Psych: Bright affect, conversant Respiratory: Breathing even and unlabored. No stridor or wheezing Abdomen: Soft, nontender, nondistended to palpation. Incisions well-healed. No incisional hernias. Port easily palpated. Extremities: Atraumatic, good range of motion.  ASSESMENT: 57 y.o.  female  s/p AP-Standard lap-band.   PLAN: The patient's port was accessed with a 20G Huber needle without difficulty. Clear fluid was aspirated and 0.3 mL saline was added to the port to give a total predicted volume of 5.05 mL. The patient was able to swallow water without difficulty following the procedure and was instructed to take clear liquids for the next 24-48 hours and advance slowly as tolerated.

## 2012-10-15 ENCOUNTER — Ambulatory Visit (HOSPITAL_COMMUNITY): Payer: 59

## 2012-10-15 ENCOUNTER — Other Ambulatory Visit: Payer: Self-pay | Admitting: Gastroenterology

## 2012-10-15 LAB — HM COLONOSCOPY

## 2012-10-16 ENCOUNTER — Ambulatory Visit (HOSPITAL_COMMUNITY)
Admission: RE | Admit: 2012-10-16 | Discharge: 2012-10-16 | Disposition: A | Discharge status: Home / Self Care | Payer: 59 | Source: Ambulatory Visit | Attending: Obstetrics and Gynecology | Admitting: Obstetrics and Gynecology

## 2012-10-16 DIAGNOSIS — Z1231 Encounter for screening mammogram for malignant neoplasm of breast: Secondary | ICD-10-CM | POA: Insufficient documentation

## 2012-11-08 ENCOUNTER — Telehealth (INDEPENDENT_AMBULATORY_CARE_PROVIDER_SITE_OTHER): Payer: Self-pay

## 2012-11-08 DIAGNOSIS — K219 Gastro-esophageal reflux disease without esophagitis: Secondary | ICD-10-CM

## 2012-11-08 MED ORDER — PANTOPRAZOLE SODIUM 40 MG PO TBEC: 40.0000 mg | DELAYED_RELEASE_TABLET | Freq: Every day | ORAL | Status: DC

## 2012-11-08 NOTE — Telephone Encounter (Signed)
Pt calling b/c she is having worse symptoms with reflux to where it's causing her to cough at night and come up thru her nose. The pt stated that you told her to get rx otc last time but she couldn't remember the name. The pt has been taking the otc medicine but it's not working anymore for the reflux. The pt states that her lap band feels fine it's the reflux that is a problem. Please advise.

## 2012-11-08 NOTE — Telephone Encounter (Signed)
Calling in rx b/c did not e-prescribe to [facility].

## 2012-11-08 NOTE — Telephone Encounter (Signed)
Called pt back after I spoke with Mardelle Matte and he recommended pt to take Protonix 40mg  #30 x11 RF's e-prescribed to [facility] per Brunilda Payor. The pt understands.

## 2012-11-09 ENCOUNTER — Telehealth: Payer: Self-pay | Admitting: Physician Assistant

## 2012-11-09 MED ORDER — CITALOPRAM HYDROBROMIDE 40 MG PO TABS: 40.0000 mg | ORAL_TABLET | Freq: Every day | ORAL | Status: DC

## 2012-11-09 NOTE — Telephone Encounter (Signed)
Citalopram HBR 40 mg tab 1 QD #90

## 2012-11-09 NOTE — Telephone Encounter (Signed)
Meds refilled.

## 2012-11-12 ENCOUNTER — Other Ambulatory Visit: Payer: Self-pay | Admitting: Obstetrics and Gynecology

## 2012-12-27 ENCOUNTER — Other Ambulatory Visit: Payer: Self-pay

## 2012-12-31 ENCOUNTER — Ambulatory Visit: Payer: 59 | Admitting: *Deleted

## 2013-01-03 ENCOUNTER — Encounter (INDEPENDENT_AMBULATORY_CARE_PROVIDER_SITE_OTHER): Payer: Commercial Managed Care - PPO

## 2013-01-15 ENCOUNTER — Encounter: Payer: 59 | Attending: General Surgery | Admitting: Dietician

## 2013-01-15 DIAGNOSIS — Z09 Encounter for follow-up examination after completed treatment for conditions other than malignant neoplasm: Secondary | ICD-10-CM | POA: Insufficient documentation

## 2013-01-15 DIAGNOSIS — Z713 Dietary counseling and surveillance: Secondary | ICD-10-CM | POA: Insufficient documentation

## 2013-01-15 DIAGNOSIS — Z9884 Bariatric surgery status: Secondary | ICD-10-CM | POA: Insufficient documentation

## 2013-01-15 NOTE — Patient Instructions (Signed)
Goals:  Continue to follow Phase 3B: High Protein + Non-Starchy Vegetables  Eat 3-6 small meals/snacks, every 3-5 hrs  Increase lean protein foods to meet 60-80g goal  Increase fluid intake to 64oz +  Add 15 grams (MAX) of carbohydrate (fruit, whole grain, starchy vegetable) with meals  Avoid drinking 15 minutes before, during and 30 minutes after eating  Aim for >30 min of physical activity daily

## 2013-01-15 NOTE — Progress Notes (Signed)
  Follow-up visit:  19 Months Post-Operative LAGB Surgery  Medical Nutrition Therapy:  Appt start time:  915   End time: 945.  Primary concerns today: Post-operative Bariatric Surgery Nutrition Management. Cathy Howell is here for f/u and reports is trying not eat at night, doesn't eat too much meat, and uses protein shake, bar, or yogurt to help get protein in. Has some problems with acid with band feeling a little tight.   Surgery date: 06/13/11 Surgery type: LAGB Start wt @ [facility]: 196.7 lbs  Weight today: 160.5 lbs    Weight change: 3.0 lbs (Gain) (2 lbs water gain) Total weight lost: 36.2 lbs Weight goal: 120-140 lbs % goal met: 47-64%  TANITA BODY COMP RESULTS   06/28/11  08/15/11  09/14/11  10/20/11 12/14/11 02/16/12 01/15/13  BMI (kg/m^2)  35.0  32.5  31.5  30.9 30.5 29.8 30.3  FM (lbs)  87.0  77.0  73.0  70.0 71.0 66.5 67.0  FFM (lbs)  98.5  95.0  93.5  93.5 90.5 91.0 93.5  TBW (lbs)  72.0  69.5  68.5  68.5 66.0 66.5 68.5   Fluid intake:  2-3 Pro shakes (22-33 oz), water, unsweet tea/diet snapple = > 64 oz Estimated total protein intake: 75-85g  Medications:  No changes reported Supplementation: Taking regularly without problems  CBG:  No longer checking; reports she sees [place] in pharmacy for prediabetes management Recent FBG: None (per MD)  Using straws: No Drinking while eating: Sips when eating meat Hair loss: Resolving Carbonated beverages: No N/V/D/C: constipation at times depending on how much she eats and drinks Last Lap-Band fill:  Fill of 0.3 cc on 09/27/12 with a total of 5.05 cc, feels close to being in the "middle zone"  Recent physical activity:  8 flights of stairs per, 10 minutes peddles bike at lunch, Walks dog daily and reports walking 7-9 miles while working (wears pedometer) and hand weights; looking to add more exercise classes in [place] Year  Progress Towards Goal(s):  In progress.  Nutritional Diagnosis:  -3.3 Overweight/obesity related to recent LAGB  surgery as evidenced by patient attempting to follow post-op nutritional guidelines for continued weight loss.  Intervention:  Nutrition education.  Monitoring/Evaluation:  Dietary intake, exercise, lap band fills, and body weight. Follow up prn.

## 2013-04-17 ENCOUNTER — Other Ambulatory Visit (HOSPITAL_COMMUNITY): Payer: Self-pay | Admitting: Obstetrics and Gynecology

## 2013-04-17 DIAGNOSIS — Z780 Asymptomatic menopausal state: Secondary | ICD-10-CM

## 2013-04-19 ENCOUNTER — Other Ambulatory Visit (HOSPITAL_COMMUNITY): Payer: Self-pay | Admitting: Obstetrics and Gynecology

## 2013-04-19 DIAGNOSIS — Z780 Asymptomatic menopausal state: Secondary | ICD-10-CM

## 2013-04-22 ENCOUNTER — Ambulatory Visit (HOSPITAL_COMMUNITY)
Admission: RE | Admit: 2013-04-22 | Discharge: 2013-04-22 | Disposition: A | Discharge status: Home / Self Care | Payer: 59 | Source: Ambulatory Visit | Attending: Obstetrics and Gynecology | Admitting: Obstetrics and Gynecology

## 2013-04-22 DIAGNOSIS — Z13820 Encounter for screening for osteoporosis: Secondary | ICD-10-CM | POA: Insufficient documentation

## 2013-04-22 DIAGNOSIS — Z780 Asymptomatic menopausal state: Secondary | ICD-10-CM | POA: Insufficient documentation

## 2013-04-25 ENCOUNTER — Telehealth: Payer: Self-pay | Admitting: Family Medicine

## 2013-04-25 DIAGNOSIS — E039 Hypothyroidism, unspecified: Secondary | ICD-10-CM

## 2013-04-25 NOTE — Telephone Encounter (Signed)
Appointment scheduled for Monday, 05/06/2013 @ 9:00am.  Requested to come by on 04/26/2013 to have labs drawn.

## 2013-04-25 NOTE — Telephone Encounter (Signed)
The only lab that we really need to do is TSH, which can be done at [place]. 02/06/2012 she had the CMET, CBC, magnesium B12 folate etc. which were all normal. She had a lipid panel done 07/15/11, which does not need to be repeated either.

## 2013-04-25 NOTE — Telephone Encounter (Signed)
Last OV 07/05/12  Asking for refill of HCTZ 25 mg.  Do not see on medication list???

## 2013-04-25 NOTE — Telephone Encounter (Signed)
Have her schedule an office visit. She is also on Celexa and Synthroid and should be seen every 6 months anyway. Overdue for regular office visits and labs.

## 2013-04-25 NOTE — Telephone Encounter (Signed)
LMTRC

## 2013-04-26 ENCOUNTER — Other Ambulatory Visit: Payer: 59

## 2013-04-26 DIAGNOSIS — E669 Obesity, unspecified: Secondary | ICD-10-CM

## 2013-04-26 DIAGNOSIS — E1110 Type 2 diabetes mellitus with ketoacidosis without coma: Secondary | ICD-10-CM

## 2013-04-26 DIAGNOSIS — Z79899 Other long term (current) drug therapy: Secondary | ICD-10-CM

## 2013-04-26 DIAGNOSIS — I10 Essential (primary) hypertension: Secondary | ICD-10-CM

## 2013-04-26 DIAGNOSIS — E039 Hypothyroidism, unspecified: Secondary | ICD-10-CM

## 2013-04-27 LAB — COMPLETE METABOLIC PANEL WITH GFR
ALBUMIN: 4.4 g/dL (ref 3.5–5.2)
ALT: 18 U/L (ref 0–35)
AST: 18 U/L (ref 0–37)
Alkaline Phosphatase: 71 U/L (ref 39–117)
BUN: 9 mg/dL (ref 6–23)
CALCIUM: 9.1 mg/dL (ref 8.4–10.5)
CHLORIDE: 101 meq/L (ref 96–112)
CO2: 27 meq/L (ref 19–32)
Creat: 0.69 mg/dL (ref 0.50–1.10)
GLUCOSE: 87 mg/dL (ref 70–99)
POTASSIUM: 3.8 meq/L (ref 3.5–5.3)
SODIUM: 140 meq/L (ref 135–145)
TOTAL PROTEIN: 7 g/dL (ref 6.0–8.3)
Total Bilirubin: 0.5 mg/dL (ref 0.2–1.2)

## 2013-04-27 LAB — CBC WITH DIFFERENTIAL/PLATELET
Basophils Absolute: 0 10*3/uL (ref 0.0–0.1)
Basophils Relative: 0 % (ref 0–1)
Eosinophils Absolute: 0.1 10*3/uL (ref 0.0–0.7)
Eosinophils Relative: 1 % (ref 0–5)
HCT: 38.8 % (ref 36.0–46.0)
HEMOGLOBIN: 13 g/dL (ref 12.0–15.0)
LYMPHS ABS: 2.1 10*3/uL (ref 0.7–4.0)
LYMPHS PCT: 35 % (ref 12–46)
MCH: 28.3 pg (ref 26.0–34.0)
MCHC: 33.5 g/dL (ref 30.0–36.0)
MCV: 84.5 fL (ref 78.0–100.0)
MONOS PCT: 7 % (ref 3–12)
Monocytes Absolute: 0.4 10*3/uL (ref 0.1–1.0)
NEUTROS PCT: 57 % (ref 43–77)
Neutro Abs: 3.4 10*3/uL (ref 1.7–7.7)
PLATELETS: 268 10*3/uL (ref 150–400)
RBC: 4.59 MIL/uL (ref 3.87–5.11)
RDW: 15 % (ref 11.5–15.5)
WBC: 5.9 10*3/uL (ref 4.0–10.5)

## 2013-04-27 LAB — TSH: TSH: 2.633 u[IU]/mL (ref 0.350–4.500)

## 2013-04-27 LAB — LIPID PANEL
Cholesterol: 259 mg/dL — ABNORMAL HIGH (ref 0–200)
HDL: 69 mg/dL (ref >39)
LDL CALC: 172 mg/dL — AB (ref 0–99)
TRIGLYCERIDES: 92 mg/dL (ref <150)
Total CHOL/HDL Ratio: 3.8 Ratio
VLDL: 18 mg/dL (ref 0–40)

## 2013-04-27 LAB — VITAMIN D 25 HYDROXY (VIT D DEFICIENCY, FRACTURES): Vit D, 25-Hydroxy: 40 ng/mL (ref 30–89)

## 2013-04-27 LAB — HEMOGLOBIN A1C
Hgb A1c MFr Bld: 5.4 % (ref <5.7)
Mean Plasma Glucose: 108 mg/dL (ref <117)

## 2013-04-29 NOTE — Telephone Encounter (Signed)
Her upcoming visit is scheduled as a "6 month followup"  not as a "Complete physical" Insurance will only pay to do screening labs when patient come in for complete physicals. Guidelines only recommend to repeat these labs every 3-5 years even for complete physical guidelines.

## 2013-04-29 NOTE — Telephone Encounter (Signed)
OK pt has appt 3/11.

## 2013-04-30 ENCOUNTER — Other Ambulatory Visit: Payer: Self-pay | Admitting: Physician Assistant

## 2013-04-30 DIAGNOSIS — Z1211 Encounter for screening for malignant neoplasm of colon: Secondary | ICD-10-CM

## 2013-05-01 ENCOUNTER — Ambulatory Visit (INDEPENDENT_AMBULATORY_CARE_PROVIDER_SITE_OTHER): Payer: 59 | Admitting: Physician Assistant

## 2013-05-01 ENCOUNTER — Encounter: Payer: Self-pay | Admitting: Physician Assistant

## 2013-05-01 VITALS — BP 122/84 | HR 80 | Temp 98.5°F | Resp 18 | Ht 59.5 in | Wt 164.0 lb

## 2013-05-01 DIAGNOSIS — I10 Essential (primary) hypertension: Secondary | ICD-10-CM

## 2013-05-01 DIAGNOSIS — R600 Localized edema: Secondary | ICD-10-CM

## 2013-05-01 DIAGNOSIS — F3289 Other specified depressive episodes: Secondary | ICD-10-CM

## 2013-05-01 DIAGNOSIS — Z9884 Bariatric surgery status: Secondary | ICD-10-CM

## 2013-05-01 DIAGNOSIS — F32A Depression, unspecified: Secondary | ICD-10-CM

## 2013-05-01 DIAGNOSIS — F329 Major depressive disorder, single episode, unspecified: Secondary | ICD-10-CM

## 2013-05-01 DIAGNOSIS — E785 Hyperlipidemia, unspecified: Secondary | ICD-10-CM

## 2013-05-01 DIAGNOSIS — R609 Edema, unspecified: Secondary | ICD-10-CM

## 2013-05-01 DIAGNOSIS — F411 Generalized anxiety disorder: Secondary | ICD-10-CM

## 2013-05-01 DIAGNOSIS — G4700 Insomnia, unspecified: Secondary | ICD-10-CM

## 2013-05-01 DIAGNOSIS — E039 Hypothyroidism, unspecified: Secondary | ICD-10-CM

## 2013-05-01 MED ORDER — SIMVASTATIN 10 MG PO TABS: 10.0000 mg | ORAL_TABLET | Freq: Every day | ORAL | Status: DC

## 2013-05-01 MED ORDER — HYDROCHLOROTHIAZIDE 25 MG PO TABS: 25.0000 mg | ORAL_TABLET | Freq: Every day | ORAL | Status: DC

## 2013-05-01 NOTE — Progress Notes (Signed)
Patient ID: Cathy Howell MRN: [id number], DOB: October 27, 1955, 58 y.o. Date of Encounter: @DATE @  Chief Complaint:  Chief Complaint  Patient presents with  . 6 month check up    has had labs    HPI: 58 y.o. year old female  Presents for routine followup office visit.  She came here from [place] in 1990. She works in the [place] with [employer].  She has had lap band surgery in the past. She had a history of hyperglycemia prior to the LAP-BAND but this has been normal since lap band surgery.  In the past she had been prescribed HCTZ to use as needed for swelling. As well she would use it as needed for elevated blood pressure. She says that after the LAP-BAND when she was losing weight she was not needing the HCTZ. However recently she has been having some days where she has some swelling. As well she checks her blood pressure at work daily. Some days her blood pressure is running a little bit high and she would like to have the HCTZ available to take at those times as well.  She is on Celexa to help with anxiety and depression symptoms. She says that this works very well. Says her job is quite stressful. For example yesterday they had multiple traumas at once. Says that she takes her Celexa and Xanax every night. With this she is able to relax and  able to sleep. Never uses Xanax during the day.   Past Medical History  Diagnosis Date  . Arthritis   . Leg pain   . Hypertension   . Diabetes mellitus   . Hyperlipidemia   . Blood transfusion     hip surgery  . Anxiety   . Depression   . Thyroid disease   . Hypothyroidism      Home Meds: See attached medication section for current medication list. Any medications entered into computer today will not appear on this note's list. The medications listed below were entered prior to today. Current Outpatient Prescriptions on File Prior to Visit  Medication Sig Dispense Refill  . BIOTIN PO Take by mouth daily. 10,000 mcg /day      .  citalopram (CELEXA) 40 MG tablet Take 1 tablet (40 mg total) by mouth at bedtime.  90 tablet  1  . levothyroxine (SYNTHROID, LEVOTHROID) 88 MCG tablet Take 1 tablet (88 mcg total) by mouth daily.  90 tablet  3  . pantoprazole (PROTONIX) 40 MG tablet Take 1 tablet (40 mg total) by mouth daily.  30 tablet  11   No current facility-administered medications on file prior to visit.    Allergies: No Known Allergies  History   Social History  . Marital Status: Married    Spouse Name: N/A    Number of Children: N/A  . Years of Education: N/A   Occupational History  . Not on file.   Social History Main Topics  . Smoking status: Former Smoker -- 1.00 packs/day for 15 years    Types: Cigarettes    Quit date: 02/22/1992  . Smokeless tobacco: Never Used  . Alcohol Use: No  . Drug Use: No  . Sexual Activity: Not on file   Other Topics Concern  . Not on file   Social History Narrative  . No narrative on file    Family History  Problem Relation Age of Onset  . Cancer Maternal Grandfather     colon     Review of Systems:  See HPI for pertinent ROS. All other ROS negative.    Physical Exam: Blood pressure 122/84, pulse 80, temperature 98.5 F (36.9 C), temperature source Oral, resp. rate 18, height 4' 11.5" (1.511 m), weight 164 lb (74.39 kg)., Body mass index is 32.58 kg/(m^2). General: WNWD WF. Appears in no acute distress. Neck: Supple. No thyromegaly. No lymphadenopathy. No carotid bruit. Lungs: Clear bilaterally to auscultation without wheezes, rales, or rhonchi. Breathing is unlabored. Heart: RRR with S1 S2. No murmurs, rubs, or gallops. Abdomen: Soft, non-tender, non-distended with normoactive bowel sounds. No hepatomegaly. No rebound/guarding. No obvious abdominal masses. Musculoskeletal:  Strength and tone normal for age. Extremities/Skin: Warm and dry. No clubbing or cyanosis. No edema. No rashes or suspicious lesions. Neuro: Alert and oriented X 3. Moves all  extremities spontaneously. Gait is normal. CNII-XII grossly in tact. Psych:  Responds to questions appropriately with a normal affect.   SHE HAD FASTING LABS DONE 04/26/2013. ALL NORMAL EXCEPT LDL 172.    ASSESSMENT AND PLAN:  58 y.o. year old female with  1. Generalized anxiety disorder Controlled with  Celexa and Xanax. 2. Depression Controlled with Celexa 3. Insomnia Controlled with Celexa and Xanax 4. Hypothyroidism TSH was normal on lab 04/26/13 will continue current dose of thyroid medication 5. Unspecified essential hypertension - hydrochlorothiazide (HYDRODIURIL) 25 MG tablet; Take 1 tablet (25 mg total) by mouth daily.  Dispense: 90 tablet; Refill: 3  6. Lower extremity edema - hydrochlorothiazide (HYDRODIURIL) 25 MG tablet; Take 1 tablet (25 mg total) by mouth daily.  Dispense: 90 tablet; Refill: 3  7. Hx of laparoscopic gastric banding  8.  H/O Morbid obesity  9. Hyperlipidemia I discussed her lipid panel with her today. She says that she is eating a very low fat diet.  Says that she really could not improve her diet at all. Discussed cardiac risk factors. She has no family history of CAD. Discussed her LDL number and she wants to go ahead and start low-dose statin therapy. Start simvastatin 10 mg. Will return fasting for FLP and LFTs in 6 weeks. - simvastatin (ZOCOR) 10 MG tablet; Take 1 tablet (10 mg total) by mouth at bedtime.  Dispense: 30 tablet; Refill: 3 - Lipid panel; Future - Hepatic function panel; Future   Signed, Olean Ree [place], [place], [facility] 05/01/2013 5:12 PM

## 2013-05-06 ENCOUNTER — Ambulatory Visit: Payer: Self-pay | Admitting: Physician Assistant

## 2013-05-16 ENCOUNTER — Encounter (INDEPENDENT_AMBULATORY_CARE_PROVIDER_SITE_OTHER): Payer: Self-pay

## 2013-05-16 ENCOUNTER — Ambulatory Visit (INDEPENDENT_AMBULATORY_CARE_PROVIDER_SITE_OTHER): Payer: Commercial Managed Care - PPO | Admitting: Physician Assistant

## 2013-05-16 VITALS — BP 124/80 | HR 75 | Temp 97.8°F | Resp 16 | Ht 61.0 in | Wt 163.6 lb

## 2013-05-16 DIAGNOSIS — Z4651 Encounter for fitting and adjustment of gastric lap band: Secondary | ICD-10-CM

## 2013-05-16 NOTE — Progress Notes (Signed)
  HISTORY: Cathy Howell is a 58 y.o.female who received an AP-Standard lap-band in April 2013 by Dr. Excell Seltzer. She comes in with 5 lbs weight loss. She is engaged in a regular exercise program and is wearing a fit bit device. She has stagnated somewhat in her weight loss and would like to get some more weight off. She notices a bit more hunger than previously but no persistent regurgitation or reflux.  VITAL SIGNS: Filed Vitals:   05/16/13 1505  BP: 124/80  Pulse: 75  Temp: 97.8 F (36.6 C)  Resp: 16    PHYSICAL EXAM: Physical exam reveals a very well-appearing 58 y.o.female in no apparent distress Neurologic: Awake, alert, oriented Psych: Bright affect, conversant Respiratory: Breathing even and unlabored. No stridor or wheezing Abdomen: Soft, nontender, nondistended to palpation. Incisions well-healed. No incisional hernias. Port easily palpated. Extremities: Atraumatic, good range of motion.  ASSESMENT: 58 y.o.  female  s/p AP-Standard lap-band.   PLAN: The patient's port was accessed with a 20G Huber needle without difficulty. Clear fluid was aspirated and 0.2 mL saline was added to the port to give a total predicted volume of 5.25 mL. The patient was able to swallow water without difficulty following the procedure and was instructed to take clear liquids for the next 24-48 hours and advance slowly as tolerated.

## 2013-05-16 NOTE — Patient Instructions (Signed)

## 2013-06-05 ENCOUNTER — Ambulatory Visit (INDEPENDENT_AMBULATORY_CARE_PROVIDER_SITE_OTHER): Payer: 59 | Admitting: Physician Assistant

## 2013-06-05 ENCOUNTER — Ambulatory Visit: Payer: 59 | Admitting: Physician Assistant

## 2013-06-05 ENCOUNTER — Encounter: Payer: Self-pay | Admitting: Physician Assistant

## 2013-06-05 VITALS — BP 136/78 | HR 80 | Temp 97.6°F | Resp 18 | Wt 158.0 lb

## 2013-06-05 DIAGNOSIS — E785 Hyperlipidemia, unspecified: Secondary | ICD-10-CM

## 2013-06-05 DIAGNOSIS — E039 Hypothyroidism, unspecified: Secondary | ICD-10-CM

## 2013-06-05 DIAGNOSIS — B9689 Other specified bacterial agents as the cause of diseases classified elsewhere: Principal | ICD-10-CM

## 2013-06-05 DIAGNOSIS — J988 Other specified respiratory disorders: Secondary | ICD-10-CM

## 2013-06-05 DIAGNOSIS — A499 Bacterial infection, unspecified: Secondary | ICD-10-CM

## 2013-06-05 MED ORDER — PREDNISONE 20 MG PO TABS: ORAL_TABLET | ORAL | Status: DC

## 2013-06-05 MED ORDER — HYDROCODONE-HOMATROPINE 5-1.5 MG/5ML PO SYRP: 5.0000 mL | ORAL_SOLUTION | Freq: Three times a day (TID) | ORAL | Status: DC | PRN

## 2013-06-05 MED ORDER — AZITHROMYCIN 250 MG PO TABS: ORAL_TABLET | ORAL | Status: DC

## 2013-06-06 ENCOUNTER — Encounter: Payer: Self-pay | Admitting: Physician Assistant

## 2013-06-06 LAB — HEPATIC FUNCTION PANEL
ALBUMIN: 4 g/dL (ref 3.5–5.2)
ALT: 8 U/L (ref 0–35)
AST: 11 U/L (ref 0–37)
Alkaline Phosphatase: 63 U/L (ref 39–117)
Bilirubin, Direct: 0.2 mg/dL (ref 0.0–0.3)
Indirect Bilirubin: 0.6 mg/dL (ref 0.2–1.2)
TOTAL PROTEIN: 7 g/dL (ref 6.0–8.3)
Total Bilirubin: 0.8 mg/dL (ref 0.2–1.2)

## 2013-06-06 LAB — LIPID PANEL
CHOLESTEROL: 162 mg/dL (ref 0–200)
HDL: 53 mg/dL (ref >39)
LDL CALC: 93 mg/dL (ref 0–99)
TRIGLYCERIDES: 82 mg/dL (ref <150)
Total CHOL/HDL Ratio: 3.1 Ratio
VLDL: 16 mg/dL (ref 0–40)

## 2013-06-06 NOTE — Progress Notes (Signed)
Patient ID: Cathy Howell MRN: [id number], DOB: [birth year], 58 y.o. Date of Encounter: @DATE @  Chief Complaint:  Chief Complaint  Patient presents with  . x 2 weeks URI    HA, chest burns, cough, fever/chills  . to do future labs    HPI: 58 y.o. year old female  presents with above complaints. Says that for 2 weeks now she has been sick. Says that she has had a lot of head and nasal congestion and a lot of pressure through her head. Also has a lot of cough. Says her head feels like it's going to explode. Says that when she takes a deep inhalation her chest hurts. Says she's been using Delsym and Mucinex with no relief. Is getting no sleep secondary to all of this congestion. Is getting minimal mucus out of her nose. Has had no known fever.  As well she is due to do followup labs from her recent visit with me. She is fasting at this time so that she can do those labs.   Past Medical History  Diagnosis Date  . Arthritis   . Leg pain   . Hypertension   . Diabetes mellitus   . Hyperlipidemia   . Blood transfusion     hip surgery  . Anxiety   . Depression   . Thyroid disease   . Hypothyroidism      Home Meds: See attached medication section for current medication list. Any medications entered into computer today will not appear on this note's list. The medications listed below were entered prior to today. Current Outpatient Prescriptions on File Prior to Visit  Medication Sig Dispense Refill  . ALPRAZolam (XANAX) 0.5 MG tablet Take 0.5 mg by mouth 3 (three) times daily as needed for anxiety.      Marland Kitchen BIOTIN PO Take by mouth daily. 10,000 mcg /day      . citalopram (CELEXA) 40 MG tablet Take 1 tablet (40 mg total) by mouth at bedtime.  90 tablet  1  . hydrochlorothiazide (HYDRODIURIL) 25 MG tablet Take 1 tablet (25 mg total) by mouth daily.  90 tablet  3  . levothyroxine (SYNTHROID, LEVOTHROID) 88 MCG tablet Take 1 tablet (88 mcg total) by mouth daily.  90 tablet  3  .  Multiple Minerals-Vitamins (CALCIUM CITRATE PLUS/MAGNESIUM PO) Take 2 each by mouth 2 (two) times daily. 600mg /1500iu/120mg       . Multiple Vitamin (MULTIVITAMIN) tablet Take 1 tablet by mouth daily.      . pantoprazole (PROTONIX) 40 MG tablet Take 1 tablet (40 mg total) by mouth daily.  30 tablet  11  . simvastatin (ZOCOR) 10 MG tablet Take 1 tablet (10 mg total) by mouth at bedtime.  30 tablet  3   No current facility-administered medications on file prior to visit.    Allergies: No Known Allergies  History   Social History  . Marital Status: Married    Spouse Name: N/A    Number of Children: N/A  . Years of Education: N/A   Occupational History  . Not on file.   Social History Main Topics  . Smoking status: Former Smoker -- 1.00 packs/day for 15 years    Types: Cigarettes    Quit date: 02/22/1992  . Smokeless tobacco: Never Used  . Alcohol Use: No  . Drug Use: No  . Sexual Activity: Not on file   Other Topics Concern  . Not on file   Social History Narrative  . No narrative on  file    Family History  Problem Relation Age of Onset  . Cancer Maternal Grandfather     colon     Review of Systems:  See HPI for pertinent ROS. All other ROS negative.    Physical Exam: Blood pressure 136/78, pulse 80, temperature 97.6 F (36.4 C), temperature source Oral, resp. rate 18, weight 158 lb (71.668 kg)., Body mass index is 29.87 kg/(m^2). General: WNWD Female. Appears in no acute distress. Head: Normocephalic, atraumatic, eyes without discharge, sclera non-icteric, nares are without discharge. Bilateral auditory canals clear, TM's are without perforation, pearly grey and translucent with reflective cone of light bilaterally. Oral cavity moist, posterior pharynx without exudate, erythema, peritonsillar abscess. She does have tenderness with percussion of frontal sinuses but no significant tenderness with percussion of bilateral maxillary sinuses.  Neck: Supple. No thyromegaly.  No lymphadenopathy. Lungs: Clear bilaterally to auscultation without wheezes, rales, or rhonchi. Breathing is unlabored. Heart: RRR with S1 S2. No murmurs, rubs, or gallops. Musculoskeletal:  Strength and tone normal for age. Extremities/Skin: Warm and dry. Neuro: Alert and oriented X 3. Moves all extremities spontaneously. Gait is normal. CNII-XII grossly in tact. Psych:  Responds to questions appropriately with a normal affect.     ASSESSMENT AND PLAN:  58 y.o. year old female with  1. Bacterial respiratory infection - azithromycin (ZITHROMAX) 250 MG tablet; Day 1: Take 2 daily.  Days 2-5: Take 1 daily.  Dispense: 6 tablet; Refill: 0 - predniSONE (DELTASONE) 20 MG tablet; Take 3 daily for 2 days, then 2 daily for 2 days, then 1 daily for 2 days.  Dispense: 12 tablet; Refill: 0 - HYDROcodone-homatropine (HYCODAN) 5-1.5 MG/5ML syrup; Take 5 mLs by mouth every 8 (eight) hours as needed for cough.  Dispense: 120 mL; Refill: 0  She states that she has never been on prednisone. Think this will help get rid of the inflammation and swelling that is causing the pressure through her head as well as the pleuritic type chest pain. She is to complete the antibiotic and complete the prednisone taper. Continues the cough medication when necessary. I am giving her a note to be out of work for today and tomorrow. All of her symptoms worsen significantly or if symptoms do not resolve after completion of antibiotics and prednisone.  2. Hyperlipidemia On labs 04/26/13 she had hyperlipidemia and started Zocor 10 mg. She wants to go ahead and do her followup labs now. She is fasting. She reports that she is taking the Zocor 10 mg as directed. She is having no myalgias and no other adverse effects. - Lipid panel - Hepatic function panel  3. Hypothyroid TSH was normal at labs 04/26/13 tissues continuing current dose so she does not need to repeat TSH for 6 months.   Marin Olp [place], [place],  [facility] 06/06/2013 7:19 AM

## 2013-06-07 ENCOUNTER — Encounter: Payer: Self-pay | Admitting: Family Medicine

## 2013-06-18 ENCOUNTER — Telehealth: Payer: Self-pay | Admitting: *Deleted

## 2013-06-18 DIAGNOSIS — B9689 Other specified bacterial agents as the cause of diseases classified elsewhere: Principal | ICD-10-CM

## 2013-06-18 DIAGNOSIS — J988 Other specified respiratory disorders: Secondary | ICD-10-CM

## 2013-06-18 MED ORDER — LEVOFLOXACIN 750 MG PO TABS
750.0000 mg | ORAL_TABLET | Freq: Every day | ORAL | Status: DC
Start: 2013-06-18 — End: 2013-09-02

## 2013-06-18 MED ORDER — HYDROCODONE-HOMATROPINE 5-1.5 MG/5ML PO SYRP: 5.0000 mL | ORAL_SOLUTION | Freq: Three times a day (TID) | ORAL | Status: DC | PRN

## 2013-06-18 NOTE — Telephone Encounter (Signed)
Can Rx: Levaquin 750 mg one po QD x 10 days  # 10 + 0. At [facility] I Rxed Hycodan. Can Refill this with 0 further refills after this one.

## 2013-06-18 NOTE — Telephone Encounter (Signed)
Pt was here week or two ago and was given a Zpak, pt states she is not feeling any better actually feels worse, wants to know if you can give her a stronger antibiotic and cough medicine, she has finished them both that was given at [place]. Please advise!

## 2013-06-18 NOTE — Telephone Encounter (Signed)
Levaquin called in.  Pt to pick up RX for cough syrup in the morning

## 2013-06-27 ENCOUNTER — Encounter (INDEPENDENT_AMBULATORY_CARE_PROVIDER_SITE_OTHER): Payer: Self-pay

## 2013-06-27 ENCOUNTER — Ambulatory Visit
Admission: RE | Admit: 2013-06-27 | Discharge: 2013-06-27 | Disposition: A | Discharge status: Home / Self Care | Payer: 59 | Source: Ambulatory Visit | Attending: Physician Assistant | Admitting: Physician Assistant

## 2013-06-27 ENCOUNTER — Telehealth (INDEPENDENT_AMBULATORY_CARE_PROVIDER_SITE_OTHER): Payer: Self-pay | Admitting: Physician Assistant

## 2013-06-27 ENCOUNTER — Ambulatory Visit (INDEPENDENT_AMBULATORY_CARE_PROVIDER_SITE_OTHER): Payer: Commercial Managed Care - PPO | Admitting: Physician Assistant

## 2013-06-27 VITALS — BP 118/80 | HR 88 | Temp 97.6°F | Ht 61.0 in | Wt 156.4 lb

## 2013-06-27 DIAGNOSIS — Z4651 Encounter for fitting and adjustment of gastric lap band: Secondary | ICD-10-CM

## 2013-06-27 NOTE — Progress Notes (Signed)
  HISTORY: Cathy Howell is a 58 y.o.female who received an AP-Standard lap-band in April 2013 by Dr. Excell Seltzer. She comes in with one month of nocturnal cough with reflux and regurgitation. She also has solid food dysphagia. She has been taking PPI daily with little relief. She would like fluid removed for relief.  VITAL SIGNS: Filed Vitals:   06/27/13 1055  BP: 118/80  Pulse: 88  Temp: 97.6 F (36.4 C)    PHYSICAL EXAM: Physical exam reveals a very well-appearing 58 y.o.female in no apparent distress Neurologic: Awake, alert, oriented Psych: Bright affect, conversant Respiratory: Breathing even and unlabored. No stridor or wheezing Abdomen: Soft, nontender, nondistended to palpation. Incisions well-healed. No incisional hernias. Port easily palpated. Extremities: Atraumatic, good range of motion.  ASSESMENT: 58 y.o.  female  s/p AP-Standard lap-band.   PLAN: The patient's port was accessed with a 20G Huber needle without difficulty. Clear fluid was aspirated and 3 mL saline was removed from the port to give a total predicted volume of 2.25 mL. The patient was advised to concentrate on healthy food choices and to avoid slider foods high in fats and carbohydrates. She was able to drink water with much greater ease following this. I've sent her for KUB today. We will follow-up on the results. I'll see her back in two weeks. I recommended a bland diet for the next couple of days with continuation of PPI.

## 2013-06-27 NOTE — Telephone Encounter (Signed)
Called Re: KUB. My initial read: no slip. Left message as such.

## 2013-06-27 NOTE — Patient Instructions (Signed)
Return in two weeks. Focus on good food choices as well as physical activity. Return sooner if you have an increase in hunger, portion sizes or weight. Return also for difficulty swallowing, night cough, reflux.   

## 2013-07-02 ENCOUNTER — Other Ambulatory Visit: Payer: Self-pay | Admitting: Physician Assistant

## 2013-07-02 DIAGNOSIS — E039 Hypothyroidism, unspecified: Secondary | ICD-10-CM

## 2013-07-02 NOTE — Telephone Encounter (Signed)
Medication refilled per protocol. 

## 2013-07-11 ENCOUNTER — Encounter (INDEPENDENT_AMBULATORY_CARE_PROVIDER_SITE_OTHER): Payer: Self-pay

## 2013-07-11 ENCOUNTER — Ambulatory Visit (INDEPENDENT_AMBULATORY_CARE_PROVIDER_SITE_OTHER): Payer: Commercial Managed Care - PPO | Admitting: Physician Assistant

## 2013-07-11 VITALS — Ht 61.0 in | Wt 165.2 lb

## 2013-07-11 DIAGNOSIS — Z4651 Encounter for fitting and adjustment of gastric lap band: Secondary | ICD-10-CM

## 2013-07-11 NOTE — Progress Notes (Signed)
  HISTORY: Cathy Howell is a 58 y.o.female who received an AP-Standard lap-band in April 2013 by Dr. Excell Seltzer. She comes in with 8 lbs weight gain since her last visit two weeks ago. She was having significant reflux for which 3 mL was removed. Her KUB revealed no evidence of slip. She has no further obstructive symptoms and is complaining of significant hunger, as expected. She'd like to get a fill today.  VITAL SIGNS: There were no vitals filed for this visit.  PHYSICAL EXAM: Physical exam reveals a very well-appearing 58 y.o.female in no apparent distress Neurologic: Awake, alert, oriented Psych: Bright affect, conversant Respiratory: Breathing even and unlabored. No stridor or wheezing Abdomen: Soft, nontender, nondistended to palpation. Incisions well-healed. No incisional hernias. Port easily palpated. Extremities: Atraumatic, good range of motion.  ASSESMENT: 58 y.o.  female  s/p AP-Standard lap-band.   PLAN: The patient's port was accessed with a 20G Huber needle without difficulty. Clear fluid was aspirated and 2 mL saline was added to the port to give a total predicted volume of 4.25 mL. The patient was able to swallow water without difficulty following the procedure and was instructed to take clear liquids for the next 24-48 hours and advance slowly as tolerated.

## 2013-07-11 NOTE — Patient Instructions (Signed)

## 2013-08-15 ENCOUNTER — Encounter (INDEPENDENT_AMBULATORY_CARE_PROVIDER_SITE_OTHER): Payer: Commercial Managed Care - PPO

## 2013-08-22 ENCOUNTER — Encounter (INDEPENDENT_AMBULATORY_CARE_PROVIDER_SITE_OTHER): Payer: Self-pay

## 2013-08-22 ENCOUNTER — Ambulatory Visit (INDEPENDENT_AMBULATORY_CARE_PROVIDER_SITE_OTHER): Payer: Commercial Managed Care - PPO | Admitting: Physician Assistant

## 2013-08-22 VITALS — BP 140/90 | HR 72 | Temp 98.0°F | Resp 14 | Ht 61.0 in | Wt 169.6 lb

## 2013-08-22 DIAGNOSIS — Z4651 Encounter for fitting and adjustment of gastric lap band: Secondary | ICD-10-CM

## 2013-08-22 NOTE — Patient Instructions (Signed)

## 2013-08-22 NOTE — Progress Notes (Signed)
  HISTORY: Cathy Howell is a 58 y.o.female who received an AP-Standard lap-band in April 2013 by Dr. Excell Seltzer. She comes in with 4 lbs weight gain since her last visit with a total of 23 lbs loss since surgery. We are doing serial fills from a band holiday that was a result of an upper respiratory infection. She is continuing to have hunger and larger than desired portions. No reflux or regurgitation.  VITAL SIGNS: Filed Vitals:   08/22/13 0947  BP: 140/90  Pulse: 72  Temp: 98 F (36.7 C)  Resp: 14    PHYSICAL EXAM: Physical exam reveals a very well-appearing 58 y.o.female in no apparent distress Neurologic: Awake, alert, oriented Psych: Bright affect, conversant Respiratory: Breathing even and unlabored. No stridor or wheezing Abdomen: Soft, nontender, nondistended to palpation. Incisions well-healed. No incisional hernias. Port easily palpated. Extremities: Atraumatic, good range of motion.  ASSESMENT: 58 y.o.  female  s/p AP-Standard lap-band.   PLAN: The patient's port was accessed with a 20G Huber needle without difficulty. Clear fluid was aspirated and 0.5 mL saline was added to the port to give a total predicted volume of 4.75 mL. The patient was able to swallow water without difficulty following the procedure and was instructed to take clear liquids for the next 24-48 hours and advance slowly as tolerated.

## 2013-09-02 ENCOUNTER — Encounter: Payer: Self-pay | Admitting: Physician Assistant

## 2013-09-02 ENCOUNTER — Ambulatory Visit (INDEPENDENT_AMBULATORY_CARE_PROVIDER_SITE_OTHER): Payer: 59 | Admitting: Physician Assistant

## 2013-09-02 VITALS — BP 122/64 | HR 64 | Temp 97.7°F | Resp 12 | Ht 60.0 in | Wt 167.0 lb

## 2013-09-02 DIAGNOSIS — E785 Hyperlipidemia, unspecified: Secondary | ICD-10-CM

## 2013-09-02 DIAGNOSIS — R350 Frequency of micturition: Secondary | ICD-10-CM

## 2013-09-02 DIAGNOSIS — N3000 Acute cystitis without hematuria: Secondary | ICD-10-CM

## 2013-09-02 LAB — URINALYSIS, ROUTINE W REFLEX MICROSCOPIC
GLUCOSE, UA: 100 mg/dL — AB
HGB URINE DIPSTICK: NEGATIVE
NITRITE: POSITIVE — AB
PROTEIN: 100 mg/dL — AB
Specific Gravity, Urine: <1.005 — ABNORMAL LOW (ref 1.005–1.030)
UROBILINOGEN UA: 2 mg/dL — AB (ref 0.0–1.0)
pH: 5 (ref 5.0–8.0)

## 2013-09-02 LAB — URINALYSIS, MICROSCOPIC ONLY
Bacteria, UA: NONE SEEN
Casts: NONE SEEN
Crystals: NONE SEEN
RBC / HPF: NONE SEEN RBC/hpf (ref <3)
WBC, UA: NONE SEEN WBC/hpf (ref <3)

## 2013-09-02 MED ORDER — CIPROFLOXACIN HCL 500 MG PO TABS: 500.0000 mg | ORAL_TABLET | Freq: Two times a day (BID) | ORAL | Status: DC

## 2013-09-02 MED ORDER — SIMVASTATIN 10 MG PO TABS: 10.0000 mg | ORAL_TABLET | Freq: Every day | ORAL | Status: DC

## 2013-09-02 NOTE — Progress Notes (Signed)
Patient ID: SHONETTE RHAMES MRN: [id number], DOB: [birth year], 58 y.o. Date of Encounter: 09/02/2013, 10:26 AM    Chief Complaint:  Chief Complaint  Patient presents with  . Possible UTI    x 3 days- lower abdominal cramping, urinary frequency  . Medication Refill    cholesterol meds     HPI: 58 y.o. year old female reports that for the last couple days she has been having some low abdominal pressure in her suprapubic area. Also says that she keeps feeling like she has to urinate and has to urgency but then she goes to the bathroom and very little urine comes out. Says even in the middle of the night she has had that sensation of needing to urinate but then very little urine present. Also is having some burning with urination. Says that she has had urinary infections in the past the same symptoms. Also reports that she has no history of GYN problems. Also reports that she is having no diarrhea or changes in stool.     Home Meds:   Outpatient Prescriptions Prior to Visit  Medication Sig Dispense Refill  . ALPRAZolam (XANAX) 0.5 MG tablet Take 0.5 mg by mouth 3 (three) times daily as needed for anxiety.      Marland Kitchen BIOTIN PO Take by mouth daily. 10,000 mcg /day      . citalopram (CELEXA) 40 MG tablet Take 1 tablet (40 mg total) by mouth at bedtime.  90 tablet  1  . hydrochlorothiazide (HYDRODIURIL) 25 MG tablet Take 1 tablet (25 mg total) by mouth daily.  90 tablet  3  . levothyroxine (SYNTHROID, LEVOTHROID) 88 MCG tablet TAKE 1 TABLET BY MOUTH DAILY.  90 tablet  1  . Multiple Minerals-Vitamins (CALCIUM CITRATE PLUS/MAGNESIUM PO) Take 2 each by mouth 2 (two) times daily. 600mg /1500iu/120mg       . Multiple Vitamin (MULTIVITAMIN) tablet Take 1 tablet by mouth daily.      . pantoprazole (PROTONIX) 40 MG tablet Take 1 tablet (40 mg total) by mouth daily.  30 tablet  11  . simvastatin (ZOCOR) 10 MG tablet Take 1 tablet (10 mg total) by mouth at bedtime.  30 tablet  3  .  HYDROcodone-homatropine (HYCODAN) 5-1.5 MG/5ML syrup Take 5 mLs by mouth every 8 (eight) hours as needed for cough.  120 mL  0  . levofloxacin (LEVAQUIN) 750 MG tablet Take 1 tablet (750 mg total) by mouth daily.  10 tablet  0   No facility-administered medications prior to visit.    Allergies: No Known Allergies    Review of Systems: See HPI for pertinent ROS. All other ROS negative.    Physical Exam: Blood pressure 122/64, pulse 64, temperature 97.7 F (36.5 C), temperature source Oral, resp. rate 12, height 5' (1.524 m), weight 167 lb (75.751 kg)., Body mass index is 32.62 kg/(m^2). General:  Appears in no acute distress. Lungs: Clear bilaterally to auscultation without wheezes, rales, or rhonchi. Breathing is unlabored. Heart: Regular rhythm. No murmurs, rubs, or gallops. Abdomen: Soft, non-tender, non-distended with normoactive bowel sounds. No hepatomegaly. No rebound/guarding. No obvious abdominal masses.Very minimal tenderness with palpation in midline suprapubic region. No other areas of tenderness. Msk:  Strength and tone normal for age.No tenderness with percussion of costophrenic angles bilaterally. Extremities/Skin: Warm and dry. Neuro: Alert and oriented X 3. Moves all extremities spontaneously. Gait is normal. CNII-XII grossly in tact. Psych:  Responds to questions appropriately with a normal affect.     ASSESSMENT AND PLAN:  58 y.o. year old female with  1. Acute cystitis without hematuria - ciprofloxacin (CIPRO) 500 MG tablet; Take 1 tablet (500 mg total) by mouth 2 (two) times daily.  Dispense: 6 tablet; Refill: 0 Is to go ahead and start antibiotic now. Will followup culture results. If her symptoms worsen she is to followup immediately.  2. Urinary frequency - Urinalysis, Routine w reflex microscopic - Urine culture  3. Hyperlipidemia She states she needs refill of Zocor sent in. Last FLP was excellent on 06/09/13. LFTs were normal at that time. Due for OV,  Fasting Labs around 9/15 - 10/15. - simvastatin (ZOCOR) 10 MG tablet; Take 1 tablet (10 mg total) by mouth at bedtime.  Dispense: 90 tablet; Refill: 0   Signed, [address] [place], [place], [facility] 09/02/2013 10:26 AM

## 2013-09-03 LAB — URINE CULTURE
COLONY COUNT: NO GROWTH
Organism ID, Bacteria: NO GROWTH

## 2013-09-05 ENCOUNTER — Ambulatory Visit: Payer: Self-pay | Admitting: Physician Assistant

## 2013-09-16 ENCOUNTER — Other Ambulatory Visit (HOSPITAL_COMMUNITY): Payer: Self-pay | Admitting: Obstetrics and Gynecology

## 2013-09-16 DIAGNOSIS — Z1231 Encounter for screening mammogram for malignant neoplasm of breast: Secondary | ICD-10-CM

## 2013-09-19 ENCOUNTER — Encounter (INDEPENDENT_AMBULATORY_CARE_PROVIDER_SITE_OTHER): Payer: Commercial Managed Care - PPO

## 2013-10-18 ENCOUNTER — Ambulatory Visit (HOSPITAL_COMMUNITY)
Admission: RE | Admit: 2013-10-18 | Discharge: 2013-10-18 | Disposition: A | Discharge status: Home / Self Care | Payer: 59 | Source: Ambulatory Visit | Attending: Obstetrics and Gynecology | Admitting: Obstetrics and Gynecology

## 2013-10-18 DIAGNOSIS — Z1231 Encounter for screening mammogram for malignant neoplasm of breast: Secondary | ICD-10-CM | POA: Diagnosis present

## 2013-11-15 ENCOUNTER — Other Ambulatory Visit: Payer: Self-pay | Admitting: Obstetrics and Gynecology

## 2013-11-18 ENCOUNTER — Ambulatory Visit (INDEPENDENT_AMBULATORY_CARE_PROVIDER_SITE_OTHER): Payer: 59 | Admitting: Family Medicine

## 2013-11-18 ENCOUNTER — Encounter: Payer: Self-pay | Admitting: Physician Assistant

## 2013-11-18 ENCOUNTER — Encounter: Payer: Self-pay | Admitting: Family Medicine

## 2013-11-18 VITALS — BP 130/90 | HR 80 | Temp 98.3°F | Resp 18 | Ht 60.0 in | Wt 167.0 lb

## 2013-11-18 DIAGNOSIS — J209 Acute bronchitis, unspecified: Secondary | ICD-10-CM

## 2013-11-18 DIAGNOSIS — J208 Acute bronchitis due to other specified organisms: Secondary | ICD-10-CM

## 2013-11-18 LAB — CYTOLOGY - PAP

## 2013-11-18 MED ORDER — AZITHROMYCIN 250 MG PO TABS: ORAL_TABLET | ORAL | Status: DC

## 2013-11-18 MED ORDER — HYDROCODONE-HOMATROPINE 5-1.5 MG/5ML PO SYRP: 5.0000 mL | ORAL_SOLUTION | Freq: Three times a day (TID) | ORAL | Status: DC | PRN

## 2013-11-18 NOTE — Progress Notes (Signed)
Subjective:    Patient ID: Cathy Howell, female    DOB: 1955/05/10, 58 y.o.   MRN: [id number]  HPI Patient presents with one week of worsening chest congestion. She has burning pain underneath her sternum. She has a cough productive of green sputum. It hurts to take a deep breath in. She reports subjective fevers and worsening shortness of breath. She denies any hemoptysis. She denies any otalgia. She denies any rhinorrhea. She does have a mild sore throat due to coughing. Past Medical History  Diagnosis Date  . Arthritis   . Leg pain   . Hypertension   . Diabetes mellitus   . Hyperlipidemia   . Blood transfusion     hip surgery  . Anxiety   . Depression   . Thyroid disease   . Hypothyroidism    Past Surgical History  Procedure Laterality Date  . Total hip arthroplasty  2005, 2007    bilateral  . Thyroidectomy  2009?  Marland Kitchen Tubal ligation    . Tonsillectomy    . Left rotator cuff  2007  . Laparoscopic gastric banding  06/13/2011    Procedure: LAPAROSCOPIC GASTRIC BANDING;  Surgeon: Edward Jolly, MD;  Location: [facility];  Service: General;  Laterality: N/A;  . Hiatal hernia repair  06/13/2011    Procedure: LAPAROSCOPIC REPAIR OF HIATAL HERNIA;  Surgeon: Edward Jolly, MD;  Location: [facility];  Service: General;;   Current Outpatient Prescriptions on File Prior to Visit  Medication Sig Dispense Refill  . ALPRAZolam (XANAX) 0.5 MG tablet Take 0.5 mg by mouth 3 (three) times daily as needed for anxiety.      Marland Kitchen BIOTIN PO Take by mouth daily. 10,000 mcg /day      . citalopram (CELEXA) 40 MG tablet Take 1 tablet (40 mg total) by mouth at bedtime.  90 tablet  1  . hydrochlorothiazide (HYDRODIURIL) 25 MG tablet Take 1 tablet (25 mg total) by mouth daily.  90 tablet  3  . levothyroxine (SYNTHROID, LEVOTHROID) 88 MCG tablet TAKE 1 TABLET BY MOUTH DAILY.  90 tablet  1  . Multiple Minerals-Vitamins (CALCIUM CITRATE PLUS/MAGNESIUM PO) Take 2 each by mouth 2 (two) times daily.  600mg /1500iu/120mg       . Multiple Vitamin (MULTIVITAMIN) tablet Take 1 tablet by mouth daily.      . pantoprazole (PROTONIX) 40 MG tablet Take 1 tablet (40 mg total) by mouth daily.  30 tablet  11  . simvastatin (ZOCOR) 10 MG tablet Take 1 tablet (10 mg total) by mouth at bedtime.  90 tablet  0   No current facility-administered medications on file prior to visit.   No Known Allergies History   Social History  . Marital Status: Married    Spouse Name: N/A    Number of Children: N/A  . Years of Education: N/A   Occupational History  . Not on file.   Social History Main Topics  . Smoking status: Former Smoker -- 1.00 packs/day for 15 years    Types: Cigarettes    Quit date: 02/22/1992  . Smokeless tobacco: Never Used  . Alcohol Use: No  . Drug Use: No  . Sexual Activity: Not on file   Other Topics Concern  . Not on file   Social History Narrative  . No narrative on file      Review of Systems  All other systems reviewed and are negative.      Objective:   Physical Exam  Vitals reviewed. Constitutional:  She appears well-developed and well-nourished. No distress.  HENT:  Right Ear: External ear normal.  Left Ear: External ear normal.  Nose: Nose normal.  Mouth/Throat: Oropharynx is clear and moist. No oropharyngeal exudate.  Eyes: Conjunctivae are normal.  Neck: Neck supple. No thyromegaly present.  Cardiovascular: Normal rate, regular rhythm and normal heart sounds.   No murmur heard. Pulmonary/Chest: Effort normal and breath sounds normal. No respiratory distress. She has no wheezes. She has no rales.  Lymphadenopathy:    She has no cervical adenopathy.  Skin: She is not diaphoretic.          Assessment & Plan:  Acute bronchitis due to other specified organisms - Plan: azithromycin (ZITHROMAX) 250 MG tablet, HYDROcodone-homatropine (HYCODAN) 5-1.5 MG/5ML syrup  Patient has bronchitis. Begin Zithromax 500 mg by mouth daily one, 250 mg by mouth daily 2  through 5. Begin Mucinex 400 mg every 4 hours as needed for cough. Patient now sees Hycodan 1 teaspoon every 4 hours as needed for cough. Recheck by Friday if no better or sooner if worse.

## 2013-11-19 ENCOUNTER — Encounter: Payer: Self-pay | Admitting: Physician Assistant

## 2013-12-20 ENCOUNTER — Other Ambulatory Visit: Payer: Self-pay | Admitting: Physician Assistant

## 2013-12-23 NOTE — Telephone Encounter (Signed)
Medication refilled per protocol. 

## 2013-12-26 ENCOUNTER — Telehealth: Payer: Self-pay | Admitting: Physician Assistant

## 2013-12-26 ENCOUNTER — Encounter: Payer: Self-pay | Admitting: Family Medicine

## 2013-12-26 NOTE — Telephone Encounter (Signed)
Patient is calling get refill on her levothyroxine for one year, the pharmacy told her to call us about this  [facility]

## 2013-12-26 NOTE — Telephone Encounter (Signed)
Tell patient that she will definitely need office visit and labs every 6 months. We usually do this for just thyroid medicine ---but, in  Addition,  she is also on blood pressure and cholesterol medicines and other medicines that need to be monitored every 6 months.

## 2013-12-26 NOTE — Telephone Encounter (Signed)
Refill denied, was just RF x 1 on 11/2.  Pt has been sent letter that NTBS

## 2013-12-26 NOTE — Telephone Encounter (Signed)
Wants one year refills for Thyroid.  OK or do you want 6 month lab?

## 2013-12-31 ENCOUNTER — Other Ambulatory Visit: Payer: Self-pay | Admitting: Occupational Medicine

## 2013-12-31 ENCOUNTER — Ambulatory Visit: Payer: Self-pay

## 2013-12-31 DIAGNOSIS — R52 Pain, unspecified: Secondary | ICD-10-CM

## 2014-01-03 DIAGNOSIS — E039 Hypothyroidism, unspecified: Secondary | ICD-10-CM

## 2014-01-03 LAB — TSH: TSH: 2.186 u[IU]/mL (ref 0.350–4.500)

## 2014-01-07 ENCOUNTER — Encounter: Payer: Self-pay | Admitting: Family Medicine

## 2014-01-07 ENCOUNTER — Other Ambulatory Visit: Payer: Self-pay | Admitting: Family Medicine

## 2014-01-07 DIAGNOSIS — E039 Hypothyroidism, unspecified: Secondary | ICD-10-CM

## 2014-01-07 MED ORDER — LEVOTHYROXINE SODIUM 88 MCG PO TABS: 88.0000 ug | ORAL_TABLET | Freq: Every day | ORAL | Status: DC

## 2014-01-24 ENCOUNTER — Ambulatory Visit (INDEPENDENT_AMBULATORY_CARE_PROVIDER_SITE_OTHER): Payer: 59 | Admitting: Family Medicine

## 2014-01-24 ENCOUNTER — Encounter: Payer: Self-pay | Admitting: Family Medicine

## 2014-01-24 VITALS — BP 118/80 | HR 86 | Temp 98.0°F | Resp 18 | Ht 60.0 in | Wt 172.0 lb

## 2014-01-24 DIAGNOSIS — F411 Generalized anxiety disorder: Secondary | ICD-10-CM

## 2014-01-24 DIAGNOSIS — E785 Hyperlipidemia, unspecified: Secondary | ICD-10-CM

## 2014-01-24 MED ORDER — VENLAFAXINE HCL ER 75 MG PO CP24: 75.0000 mg | ORAL_CAPSULE | Freq: Every day | ORAL | Status: DC

## 2014-01-24 NOTE — Progress Notes (Signed)
Subjective:    Patient ID: Cathy Howell, female    DOB: 09/24/1955, 58 y.o.   MRN: [id number]  HPI Patient is here today for follow-up of her medical conditions. She has hyperlipidemia, hypertension, and hypothyroidism. Her TSH was just recently checked and was found to be within the therapeutic range. Her blood pressure is well controlled today 118/80. She is overdue for a CMP and fasting lab work. She is fasting today and is willing to have her labs drawn. Unfortunately she continues to have problems with anxiety and insomnia. She states that she has difficulty sleeping almost on a nightly basis because she has a hard time turning off her mind. She also complains of daily stress and anxiety stemming from stress at work. Patient does not believe that the citalopram is helping. She denies any suicidal ideation. She is equivocal when asked her about depression and anhedonia. Her primary issue seems to be anxiety and fatigue. Past Medical History  Diagnosis Date  . Arthritis   . Leg pain   . Hypertension   . Diabetes mellitus   . Hyperlipidemia   . Blood transfusion     hip surgery  . Anxiety   . Depression   . Thyroid disease   . Hypothyroidism    Past Surgical History  Procedure Laterality Date  . Total hip arthroplasty  2005, 2007    bilateral  . Thyroidectomy  2009?  Marland Kitchen Tubal ligation    . Tonsillectomy    . Left rotator cuff  2007  . Laparoscopic gastric banding  06/13/2011    Procedure: LAPAROSCOPIC GASTRIC BANDING;  Surgeon: Edward Jolly, MD;  Location: [facility];  Service: General;  Laterality: N/A;  . Hiatal hernia repair  06/13/2011    Procedure: LAPAROSCOPIC REPAIR OF HIATAL HERNIA;  Surgeon: Edward Jolly, MD;  Location: [facility];  Service: General;;   Current Outpatient Prescriptions on File Prior to Visit  Medication Sig Dispense Refill  . ALPRAZolam (XANAX) 0.5 MG tablet Take 0.5 mg by mouth 3 (three) times daily as needed for anxiety.    Marland Kitchen BIOTIN PO Take  by mouth daily. 10,000 mcg /day    . citalopram (CELEXA) 40 MG tablet Take 1 tablet (40 mg total) by mouth at bedtime. 90 tablet 1  . hydrochlorothiazide (HYDRODIURIL) 25 MG tablet Take 1 tablet (25 mg total) by mouth daily. 90 tablet 3  . levothyroxine (SYNTHROID, LEVOTHROID) 88 MCG tablet Take 1 tablet (88 mcg total) by mouth daily. 90 tablet 1  . Multiple Minerals-Vitamins (CALCIUM CITRATE PLUS/MAGNESIUM PO) Take 2 each by mouth 2 (two) times daily. 600mg /1500iu/120mg     . Multiple Vitamin (MULTIVITAMIN) tablet Take 1 tablet by mouth daily.    . pantoprazole (PROTONIX) 40 MG tablet Take 1 tablet (40 mg total) by mouth daily. 30 tablet 11  . simvastatin (ZOCOR) 10 MG tablet TAKE 1 TABLET BY MOUTH AT BEDTIME. 90 tablet 1   No current facility-administered medications on file prior to visit.   No Known Allergies History   Social History  . Marital Status: Married    Spouse Name: N/A    Number of Children: N/A  . Years of Education: N/A   Occupational History  . Not on file.   Social History Main Topics  . Smoking status: Former Smoker -- 1.00 packs/day for 15 years    Types: Cigarettes    Quit date: 02/22/1992  . Smokeless tobacco: Never Used  . Alcohol Use: No  . Drug Use: No  .  Sexual Activity: Not on file   Other Topics Concern  . Not on file   Social History Narrative      Review of Systems  All other systems reviewed and are negative.      Objective:   Physical Exam  Constitutional: She appears well-developed and well-nourished.  Neck: Neck supple. No JVD present. No thyromegaly present.  Cardiovascular: Normal rate, regular rhythm and normal heart sounds.   No murmur heard. Pulmonary/Chest: Effort normal and breath sounds normal. No respiratory distress. She has no wheezes. She has no rales. She exhibits no tenderness.  Abdominal: Soft. Bowel sounds are normal. She exhibits no distension. There is no tenderness. There is no rebound and no guarding.    Musculoskeletal: She exhibits no edema.  Lymphadenopathy:    She has no cervical adenopathy.  Vitals reviewed.         Assessment & Plan:  GAD (generalized anxiety disorder) - Plan: venlafaxine XR (EFFEXOR XR) 75 MG 24 hr capsule  HLD (hyperlipidemia) - Plan: CBC with Differential, COMPLETE METABOLIC PANEL WITH GFR, Lipid panel  Patient's blood pressures well controlled. I will check a CBC, CMP, and fasting lipid panel. Her goal LDL cholesterol is less than 130. Her thyroid is appropriately dosed at 88 g of levothyroxine a day. She is compliant taking hydrochlorothiazide as well as her simvastatin. I will make no changes in her medication at this time regarding her blood pressure. However it does not appear that the citalopram is working. Therefore I will have the patient discontinue citalopram and replace it with Effexor XR 75 mg by mouth every morning. Hopefully this will help with the patient's fatigue she takes this medication in the morning and also help better control her anxiety. Recheck in one month.

## 2014-01-25 LAB — CBC WITH DIFFERENTIAL/PLATELET
BASOS ABS: 0 10*3/uL (ref 0.0–0.1)
Basophils Relative: 0 % (ref 0–1)
Eosinophils Absolute: 0.1 10*3/uL (ref 0.0–0.7)
Eosinophils Relative: 1 % (ref 0–5)
HEMATOCRIT: 37.7 % (ref 36.0–46.0)
Hemoglobin: 12.6 g/dL (ref 12.0–15.0)
Lymphocytes Relative: 26 % (ref 12–46)
Lymphs Abs: 1.6 10*3/uL (ref 0.7–4.0)
MCH: 27.3 pg (ref 26.0–34.0)
MCHC: 33.4 g/dL (ref 30.0–36.0)
MCV: 81.6 fL (ref 78.0–100.0)
MPV: 10.1 fL (ref 9.4–12.4)
Monocytes Absolute: 0.4 10*3/uL (ref 0.1–1.0)
Monocytes Relative: 6 % (ref 3–12)
NEUTROS ABS: 4.2 10*3/uL (ref 1.7–7.7)
NEUTROS PCT: 67 % (ref 43–77)
Platelets: 274 10*3/uL (ref 150–400)
RBC: 4.62 MIL/uL (ref 3.87–5.11)
RDW: 15.5 % (ref 11.5–15.5)
WBC: 6.3 10*3/uL (ref 4.0–10.5)

## 2014-01-25 LAB — COMPLETE METABOLIC PANEL WITH GFR
ALBUMIN: 4.3 g/dL (ref 3.5–5.2)
ALK PHOS: 58 U/L (ref 39–117)
ALT: 14 U/L (ref 0–35)
AST: 15 U/L (ref 0–37)
BUN: 11 mg/dL (ref 6–23)
CO2: 26 mEq/L (ref 19–32)
Calcium: 9.3 mg/dL (ref 8.4–10.5)
Chloride: 106 mEq/L (ref 96–112)
Creat: 0.71 mg/dL (ref 0.50–1.10)
GFR, Est African American: >89 mL/min
GFR, Est Non African American: >89 mL/min
Glucose, Bld: 86 mg/dL (ref 70–99)
POTASSIUM: 4.2 meq/L (ref 3.5–5.3)
Sodium: 139 mEq/L (ref 135–145)
Total Bilirubin: 0.4 mg/dL (ref 0.2–1.2)
Total Protein: 7 g/dL (ref 6.0–8.3)

## 2014-01-25 LAB — LIPID PANEL
CHOL/HDL RATIO: 3 ratio
Cholesterol: 202 mg/dL — ABNORMAL HIGH (ref 0–200)
HDL: 68 mg/dL (ref >39)
LDL Cholesterol: 118 mg/dL — ABNORMAL HIGH (ref 0–99)
Triglycerides: 79 mg/dL (ref <150)
VLDL: 16 mg/dL (ref 0–40)

## 2014-01-29 ENCOUNTER — Encounter: Payer: Self-pay | Admitting: *Deleted

## 2014-02-19 ENCOUNTER — Other Ambulatory Visit (HOSPITAL_COMMUNITY): Payer: Self-pay | Admitting: Specialist

## 2014-02-19 DIAGNOSIS — S46012A Strain of muscle(s) and tendon(s) of the rotator cuff of left shoulder, initial encounter: Secondary | ICD-10-CM

## 2014-02-28 ENCOUNTER — Encounter: Payer: Self-pay | Admitting: Family Medicine

## 2014-02-28 ENCOUNTER — Ambulatory Visit (INDEPENDENT_AMBULATORY_CARE_PROVIDER_SITE_OTHER): Payer: 59 | Admitting: Family Medicine

## 2014-02-28 VITALS — BP 126/74 | HR 84 | Temp 98.3°F | Resp 18 | Wt 169.0 lb

## 2014-02-28 DIAGNOSIS — F411 Generalized anxiety disorder: Secondary | ICD-10-CM

## 2014-02-28 MED ORDER — VENLAFAXINE HCL ER 150 MG PO TB24: 150.0000 mg | ORAL_TABLET | Freq: Every day | ORAL | Status: DC

## 2014-02-28 NOTE — Progress Notes (Signed)
Subjective:    Patient ID: Cathy Howell, female    DOB: 1955-10-15, 59 y.o.   MRN: [id number]  HPI 01/24/14 Patient is here today for follow-up of her medical conditions. She has hyperlipidemia, hypertension, and hypothyroidism. Her TSH was just recently checked and was found to be within the therapeutic range. Her blood pressure is well controlled today 118/80. She is overdue for a CMP and fasting lab work. She is fasting today and is willing to have her labs drawn. Unfortunately she continues to have problems with anxiety and insomnia. She states that she has difficulty sleeping almost on a nightly basis because she has a hard time turning off her mind. She also complains of daily stress and anxiety stemming from stress at work. Patient does not believe that the citalopram is helping. She denies any suicidal ideation. She is equivocal when asked her about depression and anhedonia. Her primary issue seems to be anxiety and fatigue.At that time, my plan was: Patient's blood pressures well controlled. I will check a CBC, CMP, and fasting lipid panel. Her goal LDL cholesterol is less than 130. Her thyroid is appropriately dosed at 88 g of levothyroxine a day. She is compliant taking hydrochlorothiazide as well as her simvastatin. I will make no changes in her medication at this time regarding her blood pressure. However it does not appear that the citalopram is working. Therefore I will have the patient discontinue citalopram and replace it with Effexor XR 75 mg by mouth every morning. Hopefully this will help with the patient's fatigue she takes this medication in the morning and also help better control her anxiety. Recheck in one month.  02/28/13 Patient states that she is doing some better on the Effexor XR 75 mg by mouth every morning.  She feels calmer although she continues to have trouble sleeping. Anxiety is not quite as bad as it was on the citalopram. The improvement has been very minimal but  it is noticeable Past Medical History  Diagnosis Date  . Arthritis   . Leg pain   . Hypertension   . Diabetes mellitus   . Hyperlipidemia   . Blood transfusion     hip surgery  . Anxiety   . Depression   . Thyroid disease   . Hypothyroidism    Past Surgical History  Procedure Laterality Date  . Total hip arthroplasty  2005, 2007    bilateral  . Thyroidectomy  2009?  Marland Kitchen Tubal ligation    . Tonsillectomy    . Left rotator cuff  2007  . Laparoscopic gastric banding  06/13/2011    Procedure: LAPAROSCOPIC GASTRIC BANDING;  Surgeon: Edward Jolly, MD;  Location: [facility];  Service: General;  Laterality: N/A;  . Hiatal hernia repair  06/13/2011    Procedure: LAPAROSCOPIC REPAIR OF HIATAL HERNIA;  Surgeon: Edward Jolly, MD;  Location: [facility];  Service: General;;   Current Outpatient Prescriptions on File Prior to Visit  Medication Sig Dispense Refill  . ALPRAZolam (XANAX) 0.5 MG tablet Take 0.5 mg by mouth 3 (three) times daily as needed for anxiety.    Marland Kitchen BIOTIN PO Take by mouth daily. 10,000 mcg /day    . citalopram (CELEXA) 40 MG tablet Take 1 tablet (40 mg total) by mouth at bedtime. 90 tablet 1  . hydrochlorothiazide (HYDRODIURIL) 25 MG tablet Take 1 tablet (25 mg total) by mouth daily. 90 tablet 3  . levothyroxine (SYNTHROID, LEVOTHROID) 88 MCG tablet Take 1 tablet (88 mcg total)  by mouth daily. 90 tablet 1  . Multiple Minerals-Vitamins (CALCIUM CITRATE PLUS/MAGNESIUM PO) Take 2 each by mouth 2 (two) times daily. 600mg /1500iu/120mg     . Multiple Vitamin (MULTIVITAMIN) tablet Take 1 tablet by mouth daily.    . pantoprazole (PROTONIX) 40 MG tablet Take 1 tablet (40 mg total) by mouth daily. 30 tablet 11  . simvastatin (ZOCOR) 10 MG tablet TAKE 1 TABLET BY MOUTH AT BEDTIME. 90 tablet 1  . venlafaxine XR (EFFEXOR XR) 75 MG 24 hr capsule Take 1 capsule (75 mg total) by mouth daily with breakfast. 30 capsule 5   No current facility-administered medications on file prior to  visit.   No Known Allergies History   Social History  . Marital Status: Married    Spouse Name: N/A    Number of Children: N/A  . Years of Education: N/A   Occupational History  . Not on file.   Social History Main Topics  . Smoking status: Former Smoker -- 1.00 packs/day for 15 years    Types: Cigarettes    Quit date: 02/22/1992  . Smokeless tobacco: Never Used  . Alcohol Use: No  . Drug Use: No  . Sexual Activity: Not on file   Other Topics Concern  . Not on file   Social History Narrative      Review of Systems  All other systems reviewed and are negative.      Objective:   Physical Exam  Constitutional: She appears well-developed and well-nourished.  Neck: Neck supple. No JVD present. No thyromegaly present.  Cardiovascular: Normal rate, regular rhythm and normal heart sounds.   No murmur heard. Pulmonary/Chest: Effort normal and breath sounds normal. No respiratory distress. She has no wheezes. She has no rales. She exhibits no tenderness.  Abdominal: Soft. Bowel sounds are normal. She exhibits no distension. There is no tenderness. There is no rebound and no guarding.  Musculoskeletal: She exhibits no edema.  Lymphadenopathy:    She has no cervical adenopathy.  Vitals reviewed.         Assessment & Plan:  GAD (generalized anxiety disorder) - Plan: Venlafaxine HCl 150 MG TB24  I would like to increase the dose of Effexor to 150 mg by mouth every morning. I recommended taking Tylenol PM at night to help her sleep. Recheck in one month

## 2014-03-10 ENCOUNTER — Other Ambulatory Visit (HOSPITAL_COMMUNITY): Payer: Self-pay

## 2014-03-10 ENCOUNTER — Ambulatory Visit (HOSPITAL_COMMUNITY): Payer: 59

## 2014-03-14 ENCOUNTER — Ambulatory Visit (HOSPITAL_COMMUNITY)
Admission: RE | Admit: 2014-03-14 | Discharge: 2014-03-14 | Disposition: A | Discharge status: Home / Self Care | Payer: PRIVATE HEALTH INSURANCE | Source: Ambulatory Visit | Attending: Specialist | Admitting: Specialist

## 2014-03-14 ENCOUNTER — Other Ambulatory Visit (HOSPITAL_COMMUNITY): Payer: Self-pay | Admitting: Specialist

## 2014-03-14 DIAGNOSIS — S46012A Strain of muscle(s) and tendon(s) of the rotator cuff of left shoulder, initial encounter: Secondary | ICD-10-CM | POA: Insufficient documentation

## 2014-03-14 MED ORDER — IOHEXOL 180 MG/ML  SOLN
20.0000 mL | Freq: Once | INTRAMUSCULAR | Status: AC | PRN
  Administered 2014-03-14: 8 mL via INTRA_ARTICULAR

## 2014-03-14 MED ORDER — GADOBENATE DIMEGLUMINE 529 MG/ML IV SOLN
5.0000 mL | Freq: Once | INTRAVENOUS | Status: AC | PRN
  Administered 2014-03-14: 1 mL via INTRAVENOUS

## 2014-04-03 ENCOUNTER — Telehealth: Payer: Self-pay | Admitting: Physician Assistant

## 2014-04-03 NOTE — Telephone Encounter (Signed)
Uses antibiotics pre dental work now needs Diflucan for yeast.

## 2014-04-03 NOTE — Telephone Encounter (Signed)
Patient is calling to see if we can call in diflucan for her if possible  [phone] [facility]

## 2014-04-03 NOTE — Telephone Encounter (Signed)
Approved. Can send Diflucan 150 mg 1 dose.

## 2014-04-04 MED ORDER — FLUCONAZOLE 150 MG PO TABS: 150.0000 mg | ORAL_TABLET | Freq: Once | ORAL | Status: DC

## 2014-04-04 NOTE — Telephone Encounter (Signed)
rx to pharmacy and pt made aware

## 2014-04-07 ENCOUNTER — Ambulatory Visit: Payer: 59

## 2014-04-15 ENCOUNTER — Ambulatory Visit: Payer: PRIVATE HEALTH INSURANCE | Attending: Physician Assistant | Admitting: Physical Therapy

## 2014-04-15 ENCOUNTER — Ambulatory Visit: Payer: Self-pay | Admitting: Physical Therapy

## 2014-04-15 DIAGNOSIS — M25512 Pain in left shoulder: Secondary | ICD-10-CM | POA: Diagnosis not present

## 2014-04-15 DIAGNOSIS — Z96643 Presence of artificial hip joint, bilateral: Secondary | ICD-10-CM | POA: Diagnosis not present

## 2014-04-15 DIAGNOSIS — E039 Hypothyroidism, unspecified: Secondary | ICD-10-CM | POA: Insufficient documentation

## 2014-04-15 DIAGNOSIS — E785 Hyperlipidemia, unspecified: Secondary | ICD-10-CM | POA: Insufficient documentation

## 2014-04-15 DIAGNOSIS — M1990 Unspecified osteoarthritis, unspecified site: Secondary | ICD-10-CM | POA: Insufficient documentation

## 2014-04-15 DIAGNOSIS — M25612 Stiffness of left shoulder, not elsewhere classified: Secondary | ICD-10-CM | POA: Insufficient documentation

## 2014-04-15 DIAGNOSIS — E119 Type 2 diabetes mellitus without complications: Secondary | ICD-10-CM | POA: Diagnosis not present

## 2014-04-15 DIAGNOSIS — I10 Essential (primary) hypertension: Secondary | ICD-10-CM | POA: Insufficient documentation

## 2014-04-15 NOTE — Patient Instructions (Signed)
ROM: Pendulum (Circular)  Let right arm move in circle clockwise, then counterclockwise, by rocking body weight in circular pattern. Circle _10___ times each direction per set. Do _3___ sessions per day.  Pendulum Side to Side  Bend forward 90 at waist, leaning on table for support. Rock body from side to side and let arm swing freely. Repeat _10___ times. Do __3__ sessions per day.  Finger Flexors  Keeping right fingertips straight, press putty toward base of palm. Repeat __20__ times. Do __3__ sessions per day. Activity: Squeeze flour sifter, plastic squeeze bottles, [place] baster, juice from fruit.*  AROM: Elbow Flexion / Extension  With left hand palm up, gently bend elbow as far as possible. Then straighten arm as far as possible. Repeat __10__ times per set. Do __3__ sessions per day.  SHOULDER: Flexion On Table  Place hands on table, elbows straight. Move hips away from body. Press hands down into table. Hold _3__ seconds. _10__ reps per set, __3_ sets per day. Posture - Sitting   Sit upright, head facing forward. Try using a roll to support lower back. Keep shoulders relaxed, and avoid rounded back. Keep hips level with knees. Avoid crossing legs for long periods.  Copyright  VHI. All rights reserved.   Cryotherapy  ICE 20 mins at most, 3 times a day following exercises.   Cryotherapy means treatment with cold. Ice or gel packs can be used to reduce both pain and swelling. Ice is the most helpful within the first 24 to 48 hours after an injury or flare-up from overusing a muscle or joint. Sprains, strains, spasms, burning pain, shooting pain, and aches can all be eased with ice. Ice can also be used when recovering from surgery. Ice is effective, has very few side effects, and is safe for most people to use. PRECAUTIONS  Ice is not a safe treatment option for people with:  Raynaud phenomenon. This is a condition affecting small blood vessels in the extremities.  Exposure to cold may cause your problems to return.  Cold hypersensitivity. There are many forms of cold hypersensitivity, including:  Cold urticaria. Red, itchy hives appear on the skin when the tissues begin to warm after being iced.  Cold erythema. This is a red, itchy rash caused by exposure to cold.  Cold hemoglobinuria. Red blood cells break down when the tissues begin to warm after being iced. The hemoglobin that carry oxygen are passed into the urine because they cannot combine with blood proteins fast enough.  Numbness or altered sensitivity in the area being iced. If you have any of the following conditions, do not use ice until you have discussed cryotherapy with your caregiver:  Heart conditions, such as arrhythmia, angina, or chronic heart disease.  High blood pressure.  Healing wounds or open skin in the area being iced.  Current infections.  Rheumatoid arthritis.  Poor circulation.  Diabetes. Ice slows the blood flow in the region it is applied. This is beneficial when trying to stop inflamed tissues from spreading irritating chemicals to surrounding tissues. However, if you expose your skin to cold temperatures for too long or without the proper protection, you can damage your skin or nerves. Watch for signs of skin damage due to cold. HOME CARE INSTRUCTIONS Follow these tips to use ice and cold packs safely.  Place a dry or damp towel between the ice and skin. A damp towel will cool the skin more quickly, so you may need to shorten the time that the ice is  used.  For a more rapid response, add gentle compression to the ice.  Ice for no more than 20 minutes at a time. The bonier the area you are icing, the less time it will take to get the benefits of ice.  Check your skin after 5 minutes to make sure there are no signs of a poor response to cold or skin damage.  Rest 20 minutes or more between uses.  Once your skin is numb, you can end your treatment. You can  test numbness by very lightly touching your skin. The touch should be so light that you do not see the skin dimple from the pressure of your fingertip. When using ice, most people will feel these normal sensations in this order: cold, burning, aching, and numbness.  Do not use ice on someone who cannot communicate their responses to pain, such as small children or people with dementia. HOW TO MAKE AN ICE PACK Ice packs are the most common way to use ice therapy. Other methods include ice massage, ice baths, and cryosprays. Muscle creams that cause a cold, tingly feeling do not offer the same benefits that ice offers and should not be used as a substitute unless recommended by your caregiver. To make an ice pack, do one of the following:  Place crushed ice or a bag of frozen vegetables in a sealable plastic bag. Squeeze out the excess air. Place this bag inside another plastic bag. Slide the bag into a pillowcase or place a damp towel between your skin and the bag.  Mix 3 parts water with 1 part rubbing alcohol. Freeze the mixture in a sealable plastic bag. When you remove the mixture from the freezer, it will be slushy. Squeeze out the excess air. Place this bag inside another plastic bag. Slide the bag into a pillowcase or place a damp towel between your skin and the bag. SEEK MEDICAL CARE IF:  You develop white spots on your skin. This may give the skin a blotchy (mottled) appearance.  Your skin turns blue or pale.  Your skin becomes waxy or hard.  Your swelling gets worse. MAKE SURE YOU:   Understand these instructions.  Will watch your condition.  Will get help right away if you are not doing well or get worse. Document Released: 10/04/2010 Document Revised: 06/24/2013 Document Reviewed: 10/04/2010 [facility] Patient Information 2015 [place], [place]. This information is not intended to replace advice given to you by your health care provider. Make sure you discuss any questions you have  with your health care provider.

## 2014-04-15 NOTE — Therapy (Signed)
[place], [place], [address] Phone: [phone]   Fax:  [phone]  Physical Therapy Evaluation  Patient Details  Name: Cathy Howell MRN: [id number] Date of Birth: 23-Jan-1956 Referring Provider:  Orlena Sheldon, PA-C  Encounter Date: 04/15/2014      PT End of Session - 04/15/14 0844    Visit Number 1   Number of Visits 8   Date for PT Re-Evaluation 05/27/14   PT Start Time 0810   PT Stop Time 0853   PT Time Calculation (min) 43 min   Activity Tolerance Patient limited by pain      Past Medical History  Diagnosis Date  . Arthritis   . Leg pain   . Hypertension   . Diabetes mellitus   . Hyperlipidemia   . Blood transfusion     hip surgery  . Anxiety   . Depression   . Thyroid disease   . Hypothyroidism     Past Surgical History  Procedure Laterality Date  . Total hip arthroplasty  2005, 2007    bilateral  . Thyroidectomy  2009?  Marland Kitchen Tubal ligation    . Tonsillectomy    . Left rotator cuff  2007  . Laparoscopic gastric banding  06/13/2011    Procedure: LAPAROSCOPIC GASTRIC BANDING;  Surgeon: Edward Jolly, MD;  Location: [facility];  Service: General;  Laterality: N/A;  . Hiatal hernia repair  06/13/2011    Procedure: LAPAROSCOPIC REPAIR OF HIATAL HERNIA;  Surgeon: Edward Jolly, MD;  Location: [facility];  Service: General;;    There were no vitals taken for this visit.  Visit Diagnosis:  Shoulder joint pain, left  Stiffness of joint, shoulder region, left      Subjective Assessment - 04/15/14 0813    Symptoms Patient injured her Lt UE while working in the [place]. She complains of pain, popping, soreness, tightness esp in am.     Pertinent History previous Lt shoulder injury with surgery 2008, THR bilateral,    Limitations Lifting;House hold activities  opening items, carrying objects   Diagnostic tests MRI   Patient Stated Goals Pt wants to go back to how it used to be.    Currently in Pain? Yes   Pain Score 7    Pain Location Shoulder   Pain Orientation Left;Anterior   Pain Descriptors / Indicators Tightness;Burning;Sore   Pain Type Chronic pain   Pain Radiating Towards armpit, lower arm   Pain Onset More than a month ago   Pain Frequency Constant   Aggravating Factors  using arm, work   Pain Relieving Factors support with pillow, Motrin, modifiying activities   Effect of Pain on Daily Activities pain interferes with all activity.           [facility] PT Assessment - 04/15/14 0827    Assessment   Medical Diagnosis L rotator cuff tear   Onset Date 12/25/13   Next MD Visit 04/28/14   Prior Therapy 2009 Rt. UE   Precautions   Precautions --  no lifting over 10 lbs   Restrictions   Weight Bearing Restrictions No   Balance Screen   Has the patient fallen in the past 6 months No   [place] residence   Prior Function   Level of Independence Independent with basic ADLs   Vocation Full time employment   Vocation Requirements OR Animal nutritionist   Cognition   Overall Cognitive Status Within Functional  Limits for tasks assessed   Sensation   Light Touch Appears Intact   Posture/Postural Control   Posture/Postural Control No significant limitations   AROM   Left Shoulder Flexion 84 Degrees   Left Shoulder ABduction 55 Degrees   Left Shoulder Internal Rotation 40 Degrees   Left Shoulder External Rotation 12 Degrees   Strength   Right Shoulder Flexion 4/5   Right Shoulder ABduction 4+/5   Right Shoulder Internal Rotation 4+/5   Right Shoulder External Rotation 4/5   Left Shoulder Flexion 2-/5   Left Shoulder ABduction 2-/5   Left Shoulder Internal Rotation 2+/5   Left Shoulder External Rotation 2-/5   Palpation   Palpation does not tolerate more than light pressure   sore throughout L AC joint, along clavicle. Reports catch   Special Tests    Special Tests --  NT due to pain          [facility] Adult PT  Treatment/Exercise - 04/15/14 0827    Cryotherapy   Number Minutes Cryotherapy 15 Minutes   Cryotherapy Location Shoulder   Type of Cryotherapy Ice pack   Electrical Stimulation   Electrical Stimulation Location IFC   Electrical Stimulation Parameters to tolerance, 10   Electrical Stimulation Goals Pain           PT Education - 04/15/14 0844    Education provided Yes   Education Details PT/POC, pendulum   Person(s) Educated Patient   Methods Explanation;Demonstration;Handout   Comprehension Need further instruction    Self care: POC, ice and pendulums, IFC effect        PT Long Term Goals - 04/15/14 1317    PT LONG TERM GOAL #1   Title Pt will be I with HEP for ROM, strength   Time 4   Period Weeks   Status New   PT LONG TERM GOAL #2   Title Pt will be able to use Lt UE 25% better for ADLs, home tasks   Time 4   Period Weeks   Status New   PT LONG TERM GOAL #3   Title PT will be able to sleep with min disturbance of pain   Time 4   Period Weeks   Status New   PT LONG TERM GOAL #4   Title Pt will score FOTO <50% impaired to demo improved functional use of LUE   Time 4   Period Weeks               Plan - 04/15/14 0850    Clinical Impression Statement This patient presents with significant pain, weakness and inability to use LUE for work, home tasks.  She will benefit from skilled PT to restore full UE function.  Approved for 8 visits.    Pt will benefit from skilled therapeutic intervention in order to improve on the following deficits Decreased range of motion;Impaired flexibility;Decreased activity tolerance;Increased fascial restricitons;Pain;Impaired UE functional use;Increased muscle spasms;Decreased mobility;Decreased strength   Rehab Potential Excellent   PT Frequency 2x / week   PT Duration 6 weeks   PT Treatment/Interventions ADLs/Self Care Home Management;Moist Heat;Therapeutic activities;Patient/family education;Passive range of  motion;Therapeutic exercise;DME Instruction;Ultrasound;Manual techniques;Dry needling;Neuromuscular re-education;Electrical Stimulation;Functional mobility training   PT Next Visit Plan check pendulum, add AAROM supine cane, modalities for pain   PT Home Exercise Plan pendulum   Consulted and Agree with Plan of Care Patient         Problem List Patient Active Problem List   Diagnosis Date Noted  . Generalized  anxiety disorder 05/01/2013  . Depression 05/01/2013  . Insomnia 05/01/2013  . Lower extremity edema 05/01/2013  . Hyperlipidemia 05/01/2013  . Hx of laparoscopic gastric banding 07/05/2012  . Hypothyroidism   . Morbid obesity 02/17/2011  . Diabetes mellitus 02/17/2011  . Hypertension 02/17/2011    Alison Breeding 04/15/2014, 1:23 PM  [facility] [address] [place], [place], [address] Phone: [phone]   Fax:  [phone]

## 2014-04-18 ENCOUNTER — Ambulatory Visit: Payer: Self-pay

## 2014-04-21 ENCOUNTER — Ambulatory Visit: Payer: PRIVATE HEALTH INSURANCE | Admitting: Physical Therapy

## 2014-04-21 ENCOUNTER — Encounter: Payer: Self-pay | Admitting: Physical Therapy

## 2014-04-21 DIAGNOSIS — M25512 Pain in left shoulder: Secondary | ICD-10-CM | POA: Diagnosis not present

## 2014-04-21 DIAGNOSIS — M25612 Stiffness of left shoulder, not elsewhere classified: Secondary | ICD-10-CM

## 2014-04-21 NOTE — Therapy (Signed)
[place], [place], [address] Phone: [phone]   Fax:  [phone]  Physical Therapy Treatment  Patient Details  Name: Cathy Howell MRN: [id number] Date of Birth: 1955-11-09 Referring Provider:  Orlena Sheldon, PA-C  Encounter Date: 04/21/2014      PT End of Session - 04/21/14 0102    Visit Number 2   Number of Visits 8   Date for PT Re-Evaluation 05/27/14   PT Start Time 0808   PT Stop Time 0855   PT Time Calculation (min) 47 min   Activity Tolerance Patient limited by pain      Past Medical History  Diagnosis Date  . Arthritis   . Leg pain   . Hypertension   . Diabetes mellitus   . Hyperlipidemia   . Blood transfusion     hip surgery  . Anxiety   . Depression   . Thyroid disease   . Hypothyroidism     Past Surgical History  Procedure Laterality Date  . Total hip arthroplasty  2005, 2007    bilateral  . Thyroidectomy  2009?  Marland Kitchen Tubal ligation    . Tonsillectomy    . Left rotator cuff  2007  . Laparoscopic gastric banding  06/13/2011    Procedure: LAPAROSCOPIC GASTRIC BANDING;  Surgeon: Edward Jolly, MD;  [facility]: [facility];  Service: General;  Laterality: N/A;  . Hiatal hernia repair  06/13/2011    Procedure: LAPAROSCOPIC REPAIR OF HIATAL HERNIA;  Surgeon: Edward Jolly, MD;  [facility]: [facility];  Service: General;;    There were no vitals taken for this visit.  Visit Diagnosis:  Shoulder joint pain, left  Stiffness of joint, shoulder region, left      Subjective Assessment - 04/21/14 0809    Symptoms Has tried exercises a few times, pain increases.  Worked this am.  Currently 6/10-7/10    Currently in Pain? Yes   Pain Score 7    Pain Orientation Left   Pain Descriptors / Indicators Burning   Multiple Pain Sites No                    [facility] Adult PT Treatment/Exercise - 04/21/14 0811    Shoulder Exercises: Stretch   Table Stretch - Flexion 5 reps   2 sets   Table Stretch - Abduction 5 reps  2 sets   Table Stretch - External Rotation 5 reps  2 sets   Other Shoulder Stretches AAROM with pulleys, 2 min    Other Shoulder Stretches reviewed pendulum ex.   needed correction to avoid using arm actively.   Moist Heat Therapy   Number Minutes Moist Heat 15 Minutes   Moist Heat Location Shoulder   Cryotherapy   Number Minutes Cryotherapy --   Cryotherapy Location --   Type of Cryotherapy --   Electrical Stimulation   Electrical Stimulation Location L Shoulder   Electrical Stimulation Action IFC   Electrical Stimulation Parameters to tolerance   Electrical Stimulation Goals Pain   Ultrasound   Ultrasound Location L ant lat shoulder   Ultrasound Parameters 1.0 W/CM2 and 20% duty   Ultrasound Goals Pain          PT Education - 04/21/14 7253    Education provided Yes   Education Details correct pendulums, AAROM   Person(s) Educated Patient   Methods Explanation;Demonstration   Comprehension Verbalized understanding;Returned demonstration          PT Short  Term Goals - 04/15/14 1314    PT SHORT TERM GOAL #1   Title --   Time --   Period --   Status --           PT Long Term Goals - 04/21/14 1459    PT LONG TERM GOAL #1   Title Pt will be I with HEP for ROM, strength   Status On-going   PT LONG TERM GOAL #2   Title Pt will be able to use Lt UE 25% better for ADLs, home tasks   Status On-going   PT LONG TERM GOAL #3   Title PT will be able to sleep with min disturbance of pain   Status On-going   PT LONG TERM GOAL #4   Title Pt will score FOTO <50% impaired to demo improved functional use of LUE   Status On-going               Plan - 04/21/14 2091    Clinical Impression Statement Pt with decreased tolerance for AAROM.  No goals met as it is 2nd session.  Responds well to IFC and Ice/heat   PT Next Visit Plan  add AAROM supine cane or table slides for HEP, modalities for pain   PT Home Exercise Plan  add AAROM (has been doing in shower)   Consulted and Agree with Plan of Care Patient        Problem List Patient Active Problem List   Diagnosis Date Noted  . Generalized anxiety disorder 05/01/2013  . Depression 05/01/2013  . Insomnia 05/01/2013  . Lower extremity edema 05/01/2013  . Hyperlipidemia 05/01/2013  . Hx of laparoscopic gastric banding 07/05/2012  . Hypothyroidism   . Morbid obesity 02/17/2011  . Diabetes mellitus 02/17/2011  . Hypertension 02/17/2011    PAA,JENNIFER 04/21/2014, 3:00 PM  [facility] [address] [place], [place], [address] Phone: [phone]   Fax:  [phone]

## 2014-04-23 ENCOUNTER — Ambulatory Visit: Payer: PRIVATE HEALTH INSURANCE | Attending: Physician Assistant | Admitting: Physical Therapy

## 2014-04-23 DIAGNOSIS — I10 Essential (primary) hypertension: Secondary | ICD-10-CM | POA: Diagnosis not present

## 2014-04-23 DIAGNOSIS — E039 Hypothyroidism, unspecified: Secondary | ICD-10-CM | POA: Insufficient documentation

## 2014-04-23 DIAGNOSIS — Z96643 Presence of artificial hip joint, bilateral: Secondary | ICD-10-CM | POA: Insufficient documentation

## 2014-04-23 DIAGNOSIS — M25612 Stiffness of left shoulder, not elsewhere classified: Secondary | ICD-10-CM | POA: Diagnosis not present

## 2014-04-23 DIAGNOSIS — M25512 Pain in left shoulder: Secondary | ICD-10-CM | POA: Diagnosis not present

## 2014-04-23 DIAGNOSIS — E785 Hyperlipidemia, unspecified: Secondary | ICD-10-CM | POA: Insufficient documentation

## 2014-04-23 DIAGNOSIS — E119 Type 2 diabetes mellitus without complications: Secondary | ICD-10-CM | POA: Insufficient documentation

## 2014-04-23 DIAGNOSIS — M1990 Unspecified osteoarthritis, unspecified site: Secondary | ICD-10-CM | POA: Diagnosis not present

## 2014-04-23 NOTE — Patient Instructions (Signed)
Cane Overhead - Supine  Hold cane at thighs with both hands, extend arms straight over head. Hold __10_ seconds. Repeat __10_ times. Do _1-2__ times per day.     Flexion (Assistive)     Clasp hands together and raise arms above head, keeping elbows as straight as possible. Can be done sitting or lying. Repeat _5__ times. Do ___1-2_ sessions per day.  Copyright  VHI. All rights reserved.  Strengthening: Isometric Flexion  Using wall for resistance, press right fist into ball using light pressure. Hold _5___ seconds. Repeat ___5_ times per set. Do ____ sets per session. Do ___2_ sessions per day.  SHOULDER: Abduction (Isometric)  Use wall as resistance. Press arm against pillow. Keep elbow straight. Hold _5__ seconds. __5_ reps per set, __2_ sets per day, ___ days per week  Extension (Isometric)  Place left bent elbow and back of arm against wall. Press elbow against wall. Hold ___5_ seconds. Repeat __5__ times. Do __2__ sessions per day.  Internal Rotation (Isometric)  Place palm of right fist against door frame, with elbow bent. Press fist against door frame. Hold _5___ seconds. Repeat ___5_ times. Do ___2_ sessions per day.  External Rotation (Isometric)  Place back of left fist against door frame, with elbow bent. Press fist against door frame. Hold __5__ seconds. Repeat _5___ times. Do ___2_ sessions per day.  Copyright  VHI. All rights reserved.

## 2014-04-23 NOTE — Therapy (Signed)
[place], [place], [address] Phone: [phone]   Fax:  [phone]  Physical Therapy Treatment  Patient Details  Name: Cathy Howell MRN: [id number] Date of Birth: 06-07-1955 Referring Provider:  Orlena Sheldon, PA-C  Encounter Date: 04/23/2014      PT End of Session - 04/23/14 0832    Visit Number 3   Number of Visits 8   Date for PT Re-Evaluation 05/27/14   PT Start Time 0759   PT Stop Time 0852   PT Time Calculation (min) 53 min   Activity Tolerance Patient limited by pain      Past Medical History  Diagnosis Date  . Arthritis   . Leg pain   . Hypertension   . Diabetes mellitus   . Hyperlipidemia   . Blood transfusion     hip surgery  . Anxiety   . Depression   . Thyroid disease   . Hypothyroidism     Past Surgical History  Procedure Laterality Date  . Total hip arthroplasty  2005, 2007    bilateral  . Thyroidectomy  2009?  Marland Kitchen Tubal ligation    . Tonsillectomy    . Left rotator cuff  2007  . Laparoscopic gastric banding  06/13/2011    Procedure: LAPAROSCOPIC GASTRIC BANDING;  Surgeon: Edward Jolly, MD;  Location: [facility];  Service: General;  Laterality: N/A;  . Hiatal hernia repair  06/13/2011    Procedure: LAPAROSCOPIC REPAIR OF HIATAL HERNIA;  Surgeon: Edward Jolly, MD;  Location: [facility];  Service: General;;    There were no vitals taken for this visit.  Visit Diagnosis:  Shoulder joint pain, left  Stiffness of joint, shoulder region, left      Subjective Assessment - 04/23/14 0802    Symptoms OK today, "too early to tell"  I took a Motrin this am.    Currently in Pain? Yes   Pain Score 7    Pain Location Shoulder   Pain Orientation Left   Pain Descriptors / Indicators Burning;Aching;Tightness   Pain Type Chronic pain         [facility] Adult PT Treatment/Exercise - 04/23/14 0804    Shoulder Exercises: Supine   Horizontal ABduction AAROM;Left;10 reps   External Rotation AAROM;Left;10 reps   Internal Rotation AAROM;Left;10 reps   Flexion AAROM;Both;10 reps   ABduction AAROM;Left;10 reps   Other Supine Exercises isometrics (manual)   Moist Heat Therapy   Number Minutes Moist Heat 20 Minutes   Moist Heat Location Shoulder   Electrical Stimulation   Electrical Stimulation Location L Shoulder   Electrical Stimulation Action IFC   Electrical Stimulation Parameters 9   Electrical Stimulation Goals Pain   Manual Therapy   Manual Therapy Passive ROM;Other (comment)   Passive ROM all planes to tol.    Other Manual Therapy Manual for L UE isometrics, flexion, abd, Er and IR.x 5 sec x 5 reps in supine           PT Education - 04/23/14 0832    Education provided Yes   Education Details isometrics   Person(s) Educated Patient   Methods Demonstration;Handout;Explanation;Verbal cues   Comprehension Verbalized understanding;Returned demonstration          PT Short Term Goals - 04/15/14 1314    PT SHORT TERM GOAL #1   Title --   Time --   Period --   Status --           PT  Long Term Goals - 04/21/14 1459    PT LONG TERM GOAL #1   Title Pt will be I with HEP for ROM, strength   Status On-going   PT LONG TERM GOAL #2   Title Pt will be able to use Lt UE 25% better for ADLs, home tasks   Status On-going   PT LONG TERM GOAL #3   Title PT will be able to sleep with min disturbance of pain   Status On-going   PT LONG TERM GOAL #4   Title Pt will score FOTO <50% impaired to demo improved functional use of LUE   Status On-going               Plan - 04/23/14 0076    Clinical Impression Statement Patient becomes nauseous with ROM/strengthening.  Admin.  HEP for isometrics.  No goals met.    PT Next Visit Plan review HEP, cont with modalities   PT Home Exercise Plan AAROM ,iso.    Consulted and Agree with Plan of Care Patient        Problem List Patient Active Problem List   Diagnosis Date Noted  . Generalized  anxiety disorder 05/01/2013  . Depression 05/01/2013  . Insomnia 05/01/2013  . Lower extremity edema 05/01/2013  . Hyperlipidemia 05/01/2013  . Hx of laparoscopic gastric banding 07/05/2012  . Hypothyroidism   . Morbid obesity 02/17/2011  . Diabetes mellitus 02/17/2011  . Hypertension 02/17/2011    PAA,JENNIFER 04/23/2014, 8:37 AM  [facility] [address] [place], [place], [address] Phone: [phone]   Fax:  [phone]

## 2014-04-28 ENCOUNTER — Ambulatory Visit: Payer: PRIVATE HEALTH INSURANCE | Admitting: Physical Therapy

## 2014-04-28 DIAGNOSIS — M25512 Pain in left shoulder: Secondary | ICD-10-CM | POA: Diagnosis not present

## 2014-04-28 NOTE — Therapy (Addendum)
[place], [place], [address] Phone: [phone]   Fax:  [phone]  Physical Therapy Treatment  Patient Details  Name: Cathy Howell MRN: [id number] Date of Birth: 01-03-1956 Referring Provider:  Orlena Sheldon, PA-C  Encounter Date: 04/28/2014      PT End of Session - 04/28/14 0858    Visit Number 4   Number of Visits 8   Date for PT Re-Evaluation 05/27/14   PT Start Time 0803   PT Stop Time 0900   PT Time Calculation (min) 57 min   Activity Tolerance Patient limited by pain      Past Medical History  Diagnosis Date  . Arthritis   . Leg pain   . Hypertension   . Diabetes mellitus   . Hyperlipidemia   . Blood transfusion     hip surgery  . Anxiety   . Depression   . Thyroid disease   . Hypothyroidism     Past Surgical History  Procedure Laterality Date  . Total hip arthroplasty  2005, 2007    bilateral  . Thyroidectomy  2009?  Marland Kitchen Tubal ligation    . Tonsillectomy    . Left rotator cuff  2007  . Laparoscopic gastric banding  06/13/2011    Procedure: LAPAROSCOPIC GASTRIC BANDING;  Surgeon: Edward Jolly, MD;  Location: [facility];  Service: General;  Laterality: N/A;  . Hiatal hernia repair  06/13/2011    Procedure: LAPAROSCOPIC REPAIR OF HIATAL HERNIA;  Surgeon: Edward Jolly, MD;  Location: [facility];  Service: General;;    There were no vitals taken for this visit.  Visit Diagnosis:  Shoulder joint pain, left      Subjective Assessment - 04/28/14 0807    Symptoms Constant pain 5-6/10 with 800 mg motrin at 3 am.  8/10 without pain   Currently in Pain? Yes   Pain Score 5    Pain Location Shoulder   Pain Orientation Left   Pain Descriptors / Indicators Burning;Aching   Pain Type Chronic pain   Pain Radiating Towards neck, anterior shoulder   Aggravating Factors  sleeping on shoulder, driving.   Pain Relieving Factors support, motrin, using rt arm   Effect of Pain on  Daily Activities limits ADL's   Multiple Pain Sites No                    [facility] Adult PT Treatment/Exercise - 04/28/14 0830    Manual Therapy   Other Manual Therapy posture correction not tolerated.  kinesiotextapingto inhibit deltiod, supraspinatus,                   PT Short Term Goals - 04/15/14 1314    PT SHORT TERM GOAL #1   Title --   Time --   Period --   Status --           PT Long Term Goals - 04/28/14 0902    PT LONG TERM GOAL #1   Title Pt will be I with HEP for ROM, strength   Time 4   Period Weeks   Status On-going   PT LONG TERM GOAL #2   Title Pt will be able to use Lt UE 25% better for ADLs, home tasks   Baseline not better   Time 4   Period Weeks   Status On-going   PT LONG TERM GOAL #3   Baseline Pain wakes her frequently   Time 4  Period Weeks   Status On-going   PT LONG TERM GOAL #4   Title Pt will score FOTO <50% impaired to demo improved functional use of LUE   Time 4   Period Weeks   Status Unable to assess               Plan - 04/28/14 0859    Clinical Impression Statement Patient said she would be unhappy if i moved her arm around today so I focused on education and pain control, patient adherent with some home exercises She reports no change with PT yet   PT Next Visit Plan review HEP, cont with modalities   Consulted and Agree with Plan of Care Patient        Problem List Patient Active Problem List   Diagnosis Date Noted  . Generalized anxiety disorder 05/01/2013  . Depression 05/01/2013  . Insomnia 05/01/2013  . Lower extremity edema 05/01/2013  . Hyperlipidemia 05/01/2013  . Hx of laparoscopic gastric banding 07/05/2012  . Hypothyroidism   . Morbid obesity 02/17/2011  . Diabetes mellitus 02/17/2011  . Hypertension 02/17/2011  Melvenia Needles, PTA 04/28/2014 9:19 AM Phone: [phone] Fax: [phone]   [facility] 04/28/2014, 9:19 AM  [facility] [address] [place], [place], [address] Phone: [phone]   Fax:  [phone]     PHYSICAL THERAPY DISCHARGE SUMMARY  Visits from Start of Care: 4  Current functional level related to goals / functional outcomes: See above, current unknown   Remaining deficits: See above   Education / Equipment: Posture, HEP and protocol, AAROM Plan: Patient agrees to discharge.  Patient goals were not met. Patient is being discharged due to not returning since the last visit.  ?????    Raeford Razor, PT 01/08/2015 2:42 PM Phone: [phone] Fax: [phone]

## 2014-04-28 NOTE — Patient Instructions (Addendum)
How to sit with good posture. Shoulder neutral, supported with pillow, lumbar curve.

## 2014-04-30 ENCOUNTER — Ambulatory Visit: Payer: PRIVATE HEALTH INSURANCE | Admitting: Physical Therapy

## 2014-05-05 ENCOUNTER — Encounter: Payer: Self-pay | Admitting: Physical Therapy

## 2014-05-07 ENCOUNTER — Encounter: Payer: Self-pay | Admitting: Physical Therapy

## 2014-05-22 ENCOUNTER — Encounter: Payer: Self-pay | Admitting: Family Medicine

## 2014-05-22 ENCOUNTER — Ambulatory Visit (INDEPENDENT_AMBULATORY_CARE_PROVIDER_SITE_OTHER): Payer: 59 | Admitting: Family Medicine

## 2014-05-22 VITALS — BP 170/110 | HR 106 | Temp 98.0°F | Resp 16 | Wt 167.0 lb

## 2014-05-22 DIAGNOSIS — J208 Acute bronchitis due to other specified organisms: Secondary | ICD-10-CM | POA: Diagnosis not present

## 2014-05-22 MED ORDER — HYDROCODONE-HOMATROPINE 5-1.5 MG/5ML PO SYRP: 5.0000 mL | ORAL_SOLUTION | Freq: Three times a day (TID) | ORAL | Status: DC | PRN

## 2014-05-22 MED ORDER — LEVOFLOXACIN 500 MG PO TABS: 500.0000 mg | ORAL_TABLET | Freq: Every day | ORAL | Status: DC

## 2014-05-22 NOTE — Progress Notes (Signed)
Subjective:    Patient ID: Cathy Howell, female    DOB: 31-Mar-1955, 59 y.o.   MRN: [id number]  HPI Patient reports 2 weeks of worsening cough. She reports subjective fevers. The cough is productive of green and brown sputum. She reports shortness of breath and chest tightness. She reports wheezing. She also reports nausea and posttussive emesis. She reports decreased appetite. She cannot rest at night due to the severity of her coughing. She also reports some sinus headache and sinus drainage. Her blood pressure and her heart rate are elevated today. This is unusual for this patient. Her previous blood pressures have all been well controlled. Past Medical History  Diagnosis Date  . Arthritis   . Leg pain   . Hypertension   . Diabetes mellitus   . Hyperlipidemia   . Blood transfusion     hip surgery  . Anxiety   . Depression   . Thyroid disease   . Hypothyroidism    Past Surgical History  Procedure Laterality Date  . Total hip arthroplasty  2005, 2007    bilateral  . Thyroidectomy  2009?  Marland Kitchen Tubal ligation    . Tonsillectomy    . Left rotator cuff  2007  . Laparoscopic gastric banding  06/13/2011    Procedure: LAPAROSCOPIC GASTRIC BANDING;  Surgeon: Edward Jolly, MD;  Location: [facility];  Service: General;  Laterality: N/A;  . Hiatal hernia repair  06/13/2011    Procedure: LAPAROSCOPIC REPAIR OF HIATAL HERNIA;  Surgeon: Edward Jolly, MD;  Location: [facility];  Service: General;;   Current Outpatient Prescriptions on File Prior to Visit  Medication Sig Dispense Refill  . ALPRAZolam (XANAX) 0.5 MG tablet Take 0.5 mg by mouth 3 (three) times daily as needed for anxiety.    Marland Kitchen BIOTIN PO Take by mouth daily. 10,000 mcg /day    . citalopram (CELEXA) 40 MG tablet Take 1 tablet (40 mg total) by mouth at bedtime. 90 tablet 1  . hydrochlorothiazide (HYDRODIURIL) 25 MG tablet Take 1 tablet (25 mg total) by mouth daily. 90 tablet 3  . ibuprofen (ADVIL,MOTRIN) 800 MG tablet Take  800 mg by mouth every 8 (eight) hours as needed.    Marland Kitchen levothyroxine (SYNTHROID, LEVOTHROID) 88 MCG tablet Take 1 tablet (88 mcg total) by mouth daily. 90 tablet 1  . Multiple Minerals-Vitamins (CALCIUM CITRATE PLUS/MAGNESIUM PO) Take 2 each by mouth 2 (two) times daily. 600mg /1500iu/120mg     . Multiple Vitamin (MULTIVITAMIN) tablet Take 1 tablet by mouth daily.    . pantoprazole (PROTONIX) 40 MG tablet Take 1 tablet (40 mg total) by mouth daily. 30 tablet 11  . simvastatin (ZOCOR) 10 MG tablet TAKE 1 TABLET BY MOUTH AT BEDTIME. 90 tablet 1  . Venlafaxine HCl 150 MG TB24 Take 1 tablet (150 mg total) by mouth daily after breakfast. 30 each 5   No current facility-administered medications on file prior to visit.   No Known Allergies History   Social History  . Marital Status: Married    Spouse Name: N/A  . Number of Children: N/A  . Years of Education: N/A   Occupational History  . Not on file.   Social History Main Topics  . Smoking status: Former Smoker -- 1.00 packs/day for 15 years    Types: Cigarettes    Quit date: 02/22/1992  . Smokeless tobacco: Never Used  . Alcohol Use: No  . Drug Use: No  . Sexual Activity: Not on file   Other Topics  Concern  . Not on file   Social History Narrative      Review of Systems  All other systems reviewed and are negative.      Objective:   Physical Exam  Constitutional: She appears well-developed and well-nourished.  HENT:  Right Ear: External ear normal.  Left Ear: External ear normal.  Nose: Nose normal.  Mouth/Throat: Oropharynx is clear and moist. No oropharyngeal exudate.  Neck: Neck supple.  Cardiovascular: Normal rate, regular rhythm and normal heart sounds.   No murmur heard. Pulmonary/Chest: Effort normal and breath sounds normal. No respiratory distress. She has no wheezes. She has no rales.  Abdominal: Soft. Bowel sounds are normal. She exhibits no distension. There is no tenderness. There is no rebound and no  guarding.  Lymphadenopathy:    She has no cervical adenopathy.  Vitals reviewed.         Assessment & Plan:  Acute bronchitis due to other specified organisms - Plan: levofloxacin (LEVAQUIN) 500 MG tablet, HYDROcodone-homatropine (HYCODAN) 5-1.5 MG/5ML syrup  I believe the patient is developed bronchitis. Begin Levaquin 500 mg by mouth daily for 7 days. The patient can use Hycodan 1 teaspoon every 8 hours as needed for cough. I recommended the patient push fluids and rest. Recheck blood pressure next week. The patient's blood pressure and heart rate I feel are a combination of dehydration and also feeling poorly and pain in her shoulder which is scheduled for surgery on. Her blood pressure remains consistently elevated which is unusual for this patient we can certainly treat this next week.

## 2014-05-28 ENCOUNTER — Encounter: Payer: Self-pay | Admitting: Family Medicine

## 2014-05-28 ENCOUNTER — Telehealth: Payer: Self-pay | Admitting: Physician Assistant

## 2014-05-28 ENCOUNTER — Encounter (HOSPITAL_BASED_OUTPATIENT_CLINIC_OR_DEPARTMENT_OTHER): Payer: Self-pay | Admitting: *Deleted

## 2014-05-28 MED ORDER — METOPROLOL TARTRATE 25 MG PO TABS: 12.5000 mg | ORAL_TABLET | Freq: Two times a day (BID) | ORAL | Status: DC

## 2014-05-28 NOTE — Telephone Encounter (Signed)
Patient is calling to let dr pickard know that her blood pressure and pulse are still high would like a call back at [phone]

## 2014-05-28 NOTE — Telephone Encounter (Signed)
[facility] will also send mychart message and med sent to pharm

## 2014-05-28 NOTE — Telephone Encounter (Signed)
Start metoprolol 25mg  , start with 1/2 tablet BID, this will help BP and HR Schedule OV for next week Continue to check BP after meds taken Continue HCTZ

## 2014-05-28 NOTE — Telephone Encounter (Signed)
Called and spoke to pt and she is feeling much better from the illness but she does c/o a really bad HA and she is concerned about her BP's and HR as they seem not to be going down any -MD please advise  157/104 - 100 135/104 - 118 145/117 - 96

## 2014-05-28 NOTE — Telephone Encounter (Signed)
Pt aware and appt made.

## 2014-05-30 ENCOUNTER — Encounter (HOSPITAL_BASED_OUTPATIENT_CLINIC_OR_DEPARTMENT_OTHER): Payer: Self-pay | Admitting: *Deleted

## 2014-05-30 NOTE — Progress Notes (Signed)
NPO AFTER MN WITH EXCEPTION CLEAR LIQUIDS UNTIL 0800 (NO CREAM/ MILK PRODUCTS).  ARRIVE AT 1200. NEEDS ISTAT AND EKG. WILL TAKE CELEXA, EFFEXOR, PROTONIX, SYNTHROID, AND METOPROLOL AM DOS W/ SIPS OF WATER.

## 2014-06-03 ENCOUNTER — Other Ambulatory Visit: Payer: Self-pay | Admitting: Orthopedic Surgery

## 2014-06-03 ENCOUNTER — Encounter (HOSPITAL_BASED_OUTPATIENT_CLINIC_OR_DEPARTMENT_OTHER)
Admission: RE | Disposition: A | Discharge status: Home / Self Care | Payer: Self-pay | Source: Ambulatory Visit | Attending: Specialist

## 2014-06-03 ENCOUNTER — Ambulatory Visit (HOSPITAL_BASED_OUTPATIENT_CLINIC_OR_DEPARTMENT_OTHER)
Admission: RE | Admit: 2014-06-03 | Discharge: 2014-06-03 | Disposition: A | Discharge status: Home / Self Care | Payer: PRIVATE HEALTH INSURANCE | Source: Ambulatory Visit | Attending: Specialist | Admitting: Specialist

## 2014-06-03 ENCOUNTER — Ambulatory Visit (HOSPITAL_BASED_OUTPATIENT_CLINIC_OR_DEPARTMENT_OTHER): Payer: PRIVATE HEALTH INSURANCE | Admitting: Anesthesiology

## 2014-06-03 ENCOUNTER — Encounter (HOSPITAL_BASED_OUTPATIENT_CLINIC_OR_DEPARTMENT_OTHER): Payer: Self-pay | Admitting: *Deleted

## 2014-06-03 DIAGNOSIS — Z79899 Other long term (current) drug therapy: Secondary | ICD-10-CM | POA: Insufficient documentation

## 2014-06-03 DIAGNOSIS — Z791 Long term (current) use of non-steroidal anti-inflammatories (NSAID): Secondary | ICD-10-CM | POA: Diagnosis not present

## 2014-06-03 DIAGNOSIS — F411 Generalized anxiety disorder: Secondary | ICD-10-CM | POA: Diagnosis not present

## 2014-06-03 DIAGNOSIS — Z79891 Long term (current) use of opiate analgesic: Secondary | ICD-10-CM | POA: Insufficient documentation

## 2014-06-03 DIAGNOSIS — Y990 Civilian activity done for income or pay: Secondary | ICD-10-CM | POA: Insufficient documentation

## 2014-06-03 DIAGNOSIS — M25512 Pain in left shoulder: Secondary | ICD-10-CM | POA: Diagnosis present

## 2014-06-03 DIAGNOSIS — I10 Essential (primary) hypertension: Secondary | ICD-10-CM | POA: Insufficient documentation

## 2014-06-03 DIAGNOSIS — E785 Hyperlipidemia, unspecified: Secondary | ICD-10-CM | POA: Diagnosis not present

## 2014-06-03 DIAGNOSIS — M1990 Unspecified osteoarthritis, unspecified site: Secondary | ICD-10-CM | POA: Diagnosis not present

## 2014-06-03 DIAGNOSIS — X58XXXA Exposure to other specified factors, initial encounter: Secondary | ICD-10-CM | POA: Insufficient documentation

## 2014-06-03 DIAGNOSIS — K219 Gastro-esophageal reflux disease without esophagitis: Secondary | ICD-10-CM | POA: Insufficient documentation

## 2014-06-03 DIAGNOSIS — Z87891 Personal history of nicotine dependence: Secondary | ICD-10-CM | POA: Diagnosis not present

## 2014-06-03 DIAGNOSIS — F329 Major depressive disorder, single episode, unspecified: Secondary | ICD-10-CM | POA: Diagnosis not present

## 2014-06-03 DIAGNOSIS — Z96643 Presence of artificial hip joint, bilateral: Secondary | ICD-10-CM | POA: Insufficient documentation

## 2014-06-03 DIAGNOSIS — S46012A Strain of muscle(s) and tendon(s) of the rotator cuff of left shoulder, initial encounter: Secondary | ICD-10-CM | POA: Insufficient documentation

## 2014-06-03 DIAGNOSIS — E039 Hypothyroidism, unspecified: Secondary | ICD-10-CM | POA: Diagnosis not present

## 2014-06-03 DIAGNOSIS — Z9889 Other specified postprocedural states: Secondary | ICD-10-CM

## 2014-06-03 HISTORY — DX: Generalized anxiety disorder: F41.1

## 2014-06-03 HISTORY — DX: Unspecified rotator cuff tear or rupture of left shoulder, not specified as traumatic: M75.102

## 2014-06-03 HISTORY — PX: SHOULDER ARTHROSCOPY WITH ROTATOR CUFF REPAIR: SHX5685

## 2014-06-03 HISTORY — DX: Postprocedural hypothyroidism: E89.0

## 2014-06-03 HISTORY — DX: Personal history of other endocrine, nutritional and metabolic disease: Z86.39

## 2014-06-03 HISTORY — DX: Acute bronchitis, unspecified: J20.9

## 2014-06-03 HISTORY — DX: Gastro-esophageal reflux disease without esophagitis: K21.9

## 2014-06-03 HISTORY — DX: Presence of spectacles and contact lenses: Z97.3

## 2014-06-03 LAB — POCT I-STAT, CHEM 8
BUN: 12 mg/dL (ref 6–23)
CALCIUM ION: 1.2 mmol/L (ref 1.12–1.23)
Chloride: 101 mmol/L (ref 96–112)
Creatinine, Ser: 0.8 mg/dL (ref 0.50–1.10)
Glucose, Bld: 91 mg/dL (ref 70–99)
HCT: 41 % (ref 36.0–46.0)
HEMOGLOBIN: 13.9 g/dL (ref 12.0–15.0)
Potassium: 3.9 mmol/L (ref 3.5–5.1)
SODIUM: 141 mmol/L (ref 135–145)
TCO2: 26 mmol/L (ref 0–100)

## 2014-06-03 LAB — GLUCOSE, CAPILLARY: GLUCOSE-CAPILLARY: 103 mg/dL — AB (ref 70–99)

## 2014-06-03 SURGERY — ARTHROSCOPY, SHOULDER, WITH ROTATOR CUFF REPAIR
Anesthesia: General | Site: Shoulder | Laterality: Left

## 2014-06-03 MED ORDER — ACETAMINOPHEN 10 MG/ML IV SOLN
INTRAVENOUS | Status: DC | PRN
  Administered 2014-06-03: 1000 mg via INTRAVENOUS

## 2014-06-03 MED ORDER — GLYCOPYRROLATE 0.2 MG/ML IJ SOLN
INTRAMUSCULAR | Status: DC | PRN
  Administered 2014-06-03: 0.2 mg via INTRAVENOUS

## 2014-06-03 MED ORDER — LACTATED RINGERS IV SOLN
INTRAVENOUS | Status: DC
  Administered 2014-06-03 (×2): via INTRAVENOUS
  Filled 2014-06-03: qty 1000

## 2014-06-03 MED ORDER — METHOCARBAMOL 500 MG PO TABS: 500.0000 mg | ORAL_TABLET | Freq: Four times a day (QID) | ORAL | Status: DC | PRN

## 2014-06-03 MED ORDER — METOPROLOL TARTRATE 1 MG/ML IV SOLN
2.5000 mg | INTRAVENOUS | Status: AC
  Administered 2014-06-03 (×4): 2.5 mg via INTRAVENOUS
  Filled 2014-06-03: qty 5

## 2014-06-03 MED ORDER — DEXAMETHASONE SODIUM PHOSPHATE 4 MG/ML IJ SOLN
INTRAMUSCULAR | Status: DC | PRN
  Administered 2014-06-03: 10 mg via INTRAVENOUS

## 2014-06-03 MED ORDER — MIDAZOLAM HCL 2 MG/2ML IJ SOLN
INTRAMUSCULAR | Status: AC
  Filled 2014-06-03: qty 2

## 2014-06-03 MED ORDER — FENTANYL CITRATE 0.05 MG/ML IJ SOLN
25.0000 ug | INTRAMUSCULAR | Status: DC | PRN
  Filled 2014-06-03: qty 1

## 2014-06-03 MED ORDER — CEFAZOLIN SODIUM-DEXTROSE 2-3 GM-% IV SOLR
2.0000 g | INTRAVENOUS | Status: AC
Start: 2014-06-04 — End: 2014-06-03
  Administered 2014-06-03: 2 g via INTRAVENOUS
  Filled 2014-06-03: qty 50

## 2014-06-03 MED ORDER — FENTANYL CITRATE 0.05 MG/ML IJ SOLN
100.0000 ug | Freq: Once | INTRAMUSCULAR | Status: AC
Start: 2014-06-03 — End: 2014-06-03
  Administered 2014-06-03: 25 ug via INTRAVENOUS
  Filled 2014-06-03: qty 2

## 2014-06-03 MED ORDER — SODIUM CHLORIDE 0.9 % IJ SOLN
INTRAMUSCULAR | Status: DC | PRN
  Administered 2014-06-03: 14:00:00

## 2014-06-03 MED ORDER — CEPHALEXIN 500 MG PO CAPS: 500.0000 mg | ORAL_CAPSULE | Freq: Three times a day (TID) | ORAL | Status: DC

## 2014-06-03 MED ORDER — FENTANYL CITRATE 0.05 MG/ML IJ SOLN
INTRAMUSCULAR | Status: AC
  Filled 2014-06-03: qty 2

## 2014-06-03 MED ORDER — SUCCINYLCHOLINE CHLORIDE 20 MG/ML IJ SOLN
INTRAMUSCULAR | Status: DC | PRN
  Administered 2014-06-03: 100 mg via INTRAVENOUS

## 2014-06-03 MED ORDER — PROPOFOL 10 MG/ML IV BOLUS
INTRAVENOUS | Status: DC | PRN
  Administered 2014-06-03: 200 mg via INTRAVENOUS

## 2014-06-03 MED ORDER — POVIDONE-IODINE 7.5 % EX SOLN
Freq: Once | CUTANEOUS | Status: DC
  Filled 2014-06-03: qty 118

## 2014-06-03 MED ORDER — LIDOCAINE HCL (CARDIAC) 20 MG/ML IV SOLN
INTRAVENOUS | Status: DC | PRN
  Administered 2014-06-03: 80 mg via INTRAVENOUS

## 2014-06-03 MED ORDER — MIDAZOLAM HCL 2 MG/2ML IJ SOLN
2.0000 mg | Freq: Once | INTRAMUSCULAR | Status: AC
  Administered 2014-06-03: 2 mg via INTRAVENOUS
  Filled 2014-06-03: qty 2

## 2014-06-03 MED ORDER — SODIUM CHLORIDE 0.9 % IR SOLN
Status: DC | PRN
  Administered 2014-06-03: 12000 mL

## 2014-06-03 MED ORDER — MIDAZOLAM HCL 5 MG/5ML IJ SOLN
INTRAMUSCULAR | Status: DC | PRN
  Administered 2014-06-03 (×2): 1 mg via INTRAVENOUS

## 2014-06-03 MED ORDER — PROMETHAZINE HCL 25 MG/ML IJ SOLN
6.2500 mg | INTRAMUSCULAR | Status: DC | PRN
  Filled 2014-06-03: qty 1

## 2014-06-03 MED ORDER — ONDANSETRON HCL 4 MG/2ML IJ SOLN
INTRAMUSCULAR | Status: DC | PRN
  Administered 2014-06-03: 4 mg via INTRAVENOUS

## 2014-06-03 MED ORDER — CEFAZOLIN SODIUM-DEXTROSE 2-3 GM-% IV SOLR
INTRAVENOUS | Status: AC
  Filled 2014-06-03: qty 50

## 2014-06-03 MED ORDER — FENTANYL CITRATE 0.05 MG/ML IJ SOLN
INTRAMUSCULAR | Status: AC
  Filled 2014-06-03: qty 4

## 2014-06-03 MED ORDER — ROPIVACAINE HCL 5 MG/ML IJ SOLN
INTRAMUSCULAR | Status: DC | PRN
  Administered 2014-06-03: 30 mL via PERINEURAL

## 2014-06-03 MED ORDER — BUPIVACAINE HCL (PF) 0.25 % IJ SOLN
INTRAMUSCULAR | Status: DC | PRN
  Administered 2014-06-03: 20 mL

## 2014-06-03 MED ORDER — FENTANYL CITRATE 0.05 MG/ML IJ SOLN
INTRAMUSCULAR | Status: DC | PRN
  Administered 2014-06-03: 50 ug via INTRAVENOUS
  Administered 2014-06-03 (×2): 25 ug via INTRAVENOUS

## 2014-06-03 MED ORDER — EPHEDRINE SULFATE 50 MG/ML IJ SOLN
INTRAMUSCULAR | Status: DC | PRN
  Administered 2014-06-03 (×2): 10 mg via INTRAVENOUS
  Administered 2014-06-03: 20 mg via INTRAVENOUS
  Administered 2014-06-03: 10 mg via INTRAVENOUS

## 2014-06-03 MED ORDER — OXYCODONE-ACETAMINOPHEN 5-325 MG PO TABS: 1.0000 | ORAL_TABLET | ORAL | Status: DC | PRN

## 2014-06-03 MED ORDER — PHENYLEPHRINE HCL 10 MG/ML IJ SOLN
INTRAMUSCULAR | Status: DC | PRN
  Administered 2014-06-03 (×2): 40 ug via INTRAVENOUS

## 2014-06-03 SURGICAL SUPPLY — 82 items
BLADE CUDA GRT WHITE 3.5 (BLADE) ×2 IMPLANT
BLADE CUTTER GATOR 3.5 (BLADE) IMPLANT
BLADE GREAT WHITE 4.2 (BLADE) ×2 IMPLANT
BLADE SURG 11 STRL SS (BLADE) ×2 IMPLANT
BLADE SURG 15 STRL LF DISP TIS (BLADE) ×1 IMPLANT
BLADE SURG 15 STRL SS (BLADE) ×1
BUR 3.5 LG SPHERICAL (BURR) ×1 IMPLANT
BUR OVAL 6.0 (BURR) ×2 IMPLANT
BURR 3.5 LG SPHERICAL (BURR) ×2
CANISTER SUCT LVC 12 LTR MEDI- (MISCELLANEOUS) ×6 IMPLANT
CANISTER SUCTION 2500CC (MISCELLANEOUS) ×2 IMPLANT
CANNULA 5.75X7 CRYSTAL CLEAR (CANNULA) ×2 IMPLANT
CANNULA 5.75X71 LONG (CANNULA) IMPLANT
CANNULA TWIST IN 8.25X7CM (CANNULA) ×4 IMPLANT
CLOTH BEACON ORANGE TIMEOUT ST (SAFETY) ×2 IMPLANT
COVER BACK TABLE 60X90IN (DRAPES) ×2 IMPLANT
COVER MAYO STAND STRL (DRAPES) ×2 IMPLANT
DRAPE LG THREE QUARTER DISP (DRAPES) IMPLANT
DRAPE ORTHO SPLIT 77X108 STRL (DRAPES) ×2
DRAPE POUCH INSTRU U-SHP 10X18 (DRAPES) ×2 IMPLANT
DRAPE STERI 35X30 U-POUCH (DRAPES) ×2 IMPLANT
DRAPE SURG 17X23 STRL (DRAPES) ×2 IMPLANT
DRAPE SURG ORHT 6 SPLT 77X108 (DRAPES) ×2 IMPLANT
DRAPE U-SHAPE 47X51 STRL (DRAPES) ×2 IMPLANT
DRSG PAD ABDOMINAL 8X10 ST (GAUZE/BANDAGES/DRESSINGS) ×2 IMPLANT
DURAPREP 26ML APPLICATOR (WOUND CARE) ×2 IMPLANT
ELECT MENISCUS 165MM 90D (ELECTRODE) IMPLANT
ELECT REM PT RETURN 9FT ADLT (ELECTROSURGICAL) ×2
ELECTRODE REM PT RTRN 9FT ADLT (ELECTROSURGICAL) ×1 IMPLANT
FIBERSTICK 2 (SUTURE) IMPLANT
GAUZE XEROFORM 1X8 LF (GAUZE/BANDAGES/DRESSINGS) ×2 IMPLANT
GLOVE BIO SURGEON STRL SZ7.5 (GLOVE) ×2 IMPLANT
GLOVE BIO SURGEON STRL SZ8 (GLOVE) ×4 IMPLANT
GLOVE INDICATOR 8.0 STRL GRN (GLOVE) ×4 IMPLANT
GOWN STRL REUS W/ TWL LRG LVL3 (GOWN DISPOSABLE) ×1 IMPLANT
GOWN STRL REUS W/ TWL XL LVL3 (GOWN DISPOSABLE) ×2 IMPLANT
GOWN STRL REUS W/TWL LRG LVL3 (GOWN DISPOSABLE) ×1
GOWN STRL REUS W/TWL XL LVL3 (GOWN DISPOSABLE) ×2
K-WIRE .045X6 DBL TRO NS (WIRE) ×2
KIT SHOULDER PASTA BRIDGE REPA (Anchor) ×2 IMPLANT
KIT SHOULDER TRACTION (DRAPES) ×2 IMPLANT
KWIRE .045X6 DBL TRO NS (WIRE) ×1 IMPLANT
LASSO SUT 90 DEGREE (SUTURE) IMPLANT
NEEDLE 1/2 CIR CATGUT .05X1.09 (NEEDLE) IMPLANT
NEEDLE HYPO 22GX1.5 SAFETY (NEEDLE) ×2 IMPLANT
NEEDLE SCORPION MULTI FIRE (NEEDLE) ×2 IMPLANT
NEEDLE SPNL 18GX3.5 QUINCKE PK (NEEDLE) ×2 IMPLANT
NS IRRIG 500ML POUR BTL (IV SOLUTION) IMPLANT
PACK BASIN DAY SURGERY FS (CUSTOM PROCEDURE TRAY) ×2 IMPLANT
PAD ABD 8X10 STRL (GAUZE/BANDAGES/DRESSINGS) ×4 IMPLANT
PENCIL BUTTON HOLSTER BLD 10FT (ELECTRODE) IMPLANT
SET ARTHROSCOPY TUBING (MISCELLANEOUS) ×1
SET ARTHROSCOPY TUBING PVC (MISCELLANEOUS) ×1 IMPLANT
SLING ULTRA II AB L (ORTHOPEDIC SUPPLIES) IMPLANT
SLING ULTRA II AB S (ORTHOPEDIC SUPPLIES) IMPLANT
SLING ULTRA II SMALL (SOFTGOODS) ×2 IMPLANT
SPONGE GAUZE 4X4 12PLY (GAUZE/BANDAGES/DRESSINGS) ×2 IMPLANT
SPONGE GAUZE 4X4 12PLY STER LF (GAUZE/BANDAGES/DRESSINGS) ×2 IMPLANT
SPONGE LAP 4X18 X RAY DECT (DISPOSABLE) IMPLANT
SUCTION FRAZIER TIP 10 FR DISP (SUCTIONS) IMPLANT
SUT 2 FIBERLOOP 20 STRT BLUE (SUTURE)
SUT ETHILON 3 0 PS 1 (SUTURE) ×2 IMPLANT
SUT FIBERWIRE #2 38 T-5 BLUE (SUTURE)
SUT LASSO 45 DEGREE LEFT (SUTURE) IMPLANT
SUT LASSO 45D RIGHT (SUTURE) IMPLANT
SUT PDS AB 1 CT1 27 (SUTURE) ×2 IMPLANT
SUT TIGER TAPE 7 IN WHITE (SUTURE) IMPLANT
SUT VIC AB 0 CT1 36 (SUTURE) IMPLANT
SUT VIC AB 2-0 CT1 27 (SUTURE)
SUT VIC AB 2-0 CT1 TAPERPNT 27 (SUTURE) IMPLANT
SUTURE 2 FIBERLOOP 20 STRT BLU (SUTURE) IMPLANT
SUTURE FIBERWR #2 38 T-5 BLUE (SUTURE) IMPLANT
SYR 20CC LL (SYRINGE) ×2 IMPLANT
SYR CONTROL 10ML LL (SYRINGE) ×2 IMPLANT
SYR TB 1ML 27GX1/2 SAFE (SYRINGE) ×1 IMPLANT
SYR TB 1ML 27GX1/2 SAFETY (SYRINGE) ×1
TAPE CLOTH SURG 6X10 WHT LF (GAUZE/BANDAGES/DRESSINGS) ×2 IMPLANT
TOWEL OR 17X24 6PK STRL BLUE (TOWEL DISPOSABLE) ×2 IMPLANT
TUBE CONNECTING 12X1/4 (SUCTIONS) ×4 IMPLANT
WAND 90 DEG TURBOVAC W/CORD (SURGICAL WAND) ×2 IMPLANT
WATER STERILE IRR 500ML POUR (IV SOLUTION) ×2 IMPLANT
YANKAUER SUCT BULB TIP NO VENT (SUCTIONS) IMPLANT

## 2014-06-03 NOTE — Transfer of Care (Signed)
   Immediate Anesthesia Transfer of Care Note  Patient: Cathy Howell  Procedure(s) Performed: Procedure(s) (LRB): LEFT SHOULDER ARTHROSCOPY WITH ROTATOR CUFF REPAIR (Left)  Patient Location: PACU  Anesthesia Type: General  Level of Consciousness: awake, sedated, patient cooperative and responds to stimulation  Airway & Oxygen Therapy: Patient Spontanous Breathing and Patient connected to face mask oxygen  Post-op Assessment: Report given to PACU RN, Post -op Vital signs reviewed and stable and Patient moving all extremities  Post vital signs: Reviewed and stable  Complications: No apparent anesthesia complications

## 2014-06-03 NOTE — Anesthesia Preprocedure Evaluation (Addendum)
Anesthesia Evaluation  Patient identified by MRN, date of birth, ID band Patient awake    Reviewed: Allergy & Precautions, NPO status , Patient's Chart, lab work & pertinent test results  Airway Mallampati: II  TM Distance: >3 FB Neck ROM: Full    Dental no notable dental hx.    Pulmonary former smoker,  breath sounds clear to auscultation  Pulmonary exam normal       Cardiovascular Exercise Tolerance: Good hypertension, Pt. on medications and Pt. on home beta blockers Rhythm:Regular Rate:Normal     Neuro/Psych PSYCHIATRIC DISORDERS Depression negative neurological ROS     GI/Hepatic negative GI ROS, Neg liver ROS, GERD-  Medicated,  Endo/Other  diabetes, Type 2, Oral Hypoglycemic AgentsHypothyroidism   Renal/GU negative Renal ROS  negative genitourinary   Musculoskeletal  (+) Arthritis -,   Abdominal   Peds negative pediatric ROS (+)  Hematology negative hematology ROS (+)   Anesthesia Other Findings   Reproductive/Obstetrics negative OB ROS                            Anesthesia Physical Anesthesia Plan  ASA: III  Anesthesia Plan: General   Post-op Pain Management:    Induction: Intravenous  Airway Management Planned: Oral ETT  Additional Equipment:   Intra-op Plan:   Post-operative Plan: Extubation in OR  Informed Consent: I have reviewed the patients History and Physical, chart, labs and discussed the procedure including the risks, benefits and alternatives for the proposed anesthesia with the patient or authorized representative who has indicated his/her understanding and acceptance.   Dental advisory given  Plan Discussed with: CRNA  Anesthesia Plan Comments: (Discussed risks and benefits of interscalene block including failure, bleeding, infection, nerve damage, weakness. Questions answered. Patient consents to block.)        Anesthesia Quick Evaluation

## 2014-06-03 NOTE — Discharge Instructions (Signed)

## 2014-06-03 NOTE — Interval H&P Note (Signed)
History and Physical Interval Note:  06/03/2014 12:35 PM  [name]  has presented today for surgery, with the diagnosis of LEFT SHOULDER ROTATOR CUFF TEAR  The various methods of treatment have been discussed with the patient and family. After consideration of risks, benefits and other options for treatment, the patient has consented to  Procedure(s): LEFT SHOULDER ARTHROSCOPY WITH ROTATOR CUFF REPAIR (Left) as a surgical intervention .  The patient's history has been reviewed, patient examined, no change in status, stable for surgery.  I have reviewed the patient's chart and labs.  Questions were answered to the patient's satisfaction.     Lanyiah Brix ANDREW

## 2014-06-03 NOTE — Anesthesia Postprocedure Evaluation (Signed)
  Anesthesia Post-op Note  Patient: Cathy Howell  Procedure(s) Performed: Procedure(s) (LRB): LEFT SHOULDER ARTHROSCOPY WITH ROTATOR CUFF REPAIR (Left)  Patient Location: PACU  Anesthesia Type: GA combined with regional for post-op pain  Level of Consciousness: awake and alert   Airway and Oxygen Therapy: Patient Spontanous Breathing  Post-op Pain: mild  Post-op Assessment: Post-op Vital signs reviewed, Patient's Cardiovascular Status Stable, Respiratory Function Stable, Patent Airway and No signs of Nausea or vomiting  Last Vitals:  Filed Vitals:   06/03/14 1545  BP: 153/80  Pulse: 117  Temp:   Resp: 18    Post-op Vital Signs: stable   Complications: No apparent anesthesia complications

## 2014-06-03 NOTE — Op Note (Signed)
Dictated" [id number]

## 2014-06-03 NOTE — H&P (View-Only) (Signed)
NPO AFTER MN WITH EXCEPTION CLEAR LIQUIDS UNTIL 0800 (NO CREAM/ MILK PRODUCTS).  ARRIVE AT 1200. NEEDS ISTAT AND EKG. WILL TAKE CELEXA, EFFEXOR, PROTONIX, SYNTHROID, AND METOPROLOL AM DOS W/ SIPS OF WATER.

## 2014-06-03 NOTE — Anesthesia Procedure Notes (Addendum)
Anesthesia Regional Block:  Interscalene brachial plexus block  Pre-Anesthetic Checklist: ,, timeout performed, Correct Patient, Correct Site, Correct Laterality, Correct Procedure, Correct Position, site marked, Risks and benefits discussed,  Surgical consent,  Pre-op evaluation,  At surgeon's request and post-op pain management  Laterality: Left and Upper  Prep: chloraprep       Needles:  Injection technique: Single-shot  Needle Type: Stimulator Needle - 80      Needle Gauge: 21 and 21 G    Additional Needles:  Procedures: ultrasound guided (picture in chart) and nerve stimulator Interscalene brachial plexus block  Nerve Stimulator or Paresthesia:  Response: forearm, 0.5 mA,   Additional Responses:   Narrative:  Start time: 06/03/2014 12:55 PM End time: 06/03/2014 1:06 PM Injection made incrementally with aspirations every 5 mL.  Performed by: Personally  Anesthesiologist: Franne Grip  Additional Notes: Meaningful verbal contact maintained throughout block placement.  No pain on injection. No increased resistance to injection.  Motor intact immediately after block. Loss of deltoid function at 20 minutes.     Procedure Name: Intubation Date/Time: 06/03/2014 2:08 PM Performed by: Justice Rocher Pre-anesthesia Checklist: Patient identified, Emergency Drugs available, Suction available and Patient being monitored Patient Re-evaluated:Patient Re-evaluated prior to inductionOxygen Delivery Method: Circle System Utilized Preoxygenation: Pre-oxygenation with 100% oxygen Intubation Type: IV induction Ventilation: Mask ventilation without difficulty Laryngoscope Size: Mac and 3 Grade View: Grade II Tube type: Oral Tube size: 7.0 mm Number of attempts: 1 Airway Equipment and Method: Stylet and Oral airway Placement Confirmation: ETT inserted through vocal cords under direct vision,  positive ETCO2 and breath sounds checked- equal and bilateral Secured at: 22 cm Tube  secured with: Tape Dental Injury: Teeth and Oropharynx as per pre-operative assessment

## 2014-06-03 NOTE — H&P (Signed)
Cathy Howell is an 59 y.o. female.   Chief Complaint: Left shoulder pain HPI: Patient presents with joint discomfort that had been persistent for several months now. Related to a injury while at work. History of Left shoulder SAD, DCR, and RCR in 2008. Despite conservative treatments, her discomfort has not improved. Imaging was obtained. Other conservative and surgical treatments were discussed in detail. Patient wishes to proceed with surgery as consented. Denies SOB, CP, or calf pain. No Fever, chills, or nausea/ vomiting.   Past Medical History  Diagnosis Date  . Arthritis   . Hypertension   . Hyperlipidemia   . Depression   . Generalized anxiety disorder   . GERD (gastroesophageal reflux disease)   . Left rotator cuff tear   . Acute bronchitis     dx 05-22-2014---  per pt resolved no productive cough as of 05-30-2014  . History of diabetes mellitus     BORDERLINE PRIOR TO GASTRIC BAND--  NO LONGER ISSUE  . Wears glasses   . Hypothyroidism, postsurgical     Past Surgical History  Procedure Laterality Date  . Total hip arthroplasty Bilateral right 08-21-2006/   left  2005  . Thyroidectomy Bilateral 2007    benign mass  . Laparoscopic gastric banding  06/13/2011    Procedure: LAPAROSCOPIC GASTRIC BANDING;  Surgeon: Edward Jolly, MD;  Location: [facility];  Service: General;  Laterality: N/A;  . Hiatal hernia repair  06/13/2011    Procedure: LAPAROSCOPIC REPAIR OF HIATAL HERNIA;  Surgeon: Edward Jolly, MD;  Location: [facility];  Service: General;;  . Left shoulder arthroscopy/  sad/  acromioplasty/  bursectomy/  ca ligament release/  dcr/  rotator cuff repair  09-21-2006  . Tubal ligation  1996  . Tonsillectomy  1960's    Family History  Problem Relation Age of Onset  . Cancer Maternal Grandfather     colon   Social History:  reports that she quit smoking about 22 years ago. Her smoking use included Cigarettes. She has a 15 pack-year smoking history. She has never  used smokeless tobacco. She reports that she does not drink alcohol or use illicit drugs.  Allergies: No Known Allergies  Medications Prior to Admission  Medication Sig Dispense Refill  . acetaminophen (TYLENOL) 500 MG tablet Take 1,000 mg by mouth every 6 (six) hours as needed.    . ALPRAZolam (XANAX) 0.5 MG tablet Take 0.5 mg by mouth 3 (three) times daily as needed for anxiety.    . Biotin 10 MG TABS Take 1 tablet by mouth daily. Equals 10,000 mcg    . citalopram (CELEXA) 40 MG tablet Take 1 tablet (40 mg total) by mouth at bedtime. 90 tablet 1  . docusate sodium (COLACE) 100 MG capsule Take 100 mg by mouth 2 (two) times daily.    Marland Kitchen HYDROcodone-homatropine (HYCODAN) 5-1.5 MG/5ML syrup Take 5 mLs by mouth every 8 (eight) hours as needed for cough. 120 mL 0  . ibuprofen (ADVIL,MOTRIN) 800 MG tablet Take 800 mg by mouth every 8 (eight) hours as needed.    Marland Kitchen levothyroxine (SYNTHROID, LEVOTHROID) 88 MCG tablet Take 1 tablet (88 mcg total) by mouth daily. (Patient taking differently: Take 88 mcg by mouth daily before breakfast. ) 90 tablet 1  . metoprolol tartrate (LOPRESSOR) 25 MG tablet Take 0.5 tablets (12.5 mg total) by mouth 2 (two) times daily. 30 tablet 3  . Multiple Minerals-Vitamins (CALCIUM CITRATE PLUS/MAGNESIUM PO) Take 2 each by mouth 2 (two) times daily. 600mg /1500iu/120mg     .  Multiple Vitamin (MULTIVITAMIN) tablet Take 1 tablet by mouth daily.    . pantoprazole (PROTONIX) 40 MG tablet Take 1 tablet (40 mg total) by mouth daily. (Patient taking differently: Take 40 mg by mouth as needed. ) 30 tablet 11  . simvastatin (ZOCOR) 10 MG tablet TAKE 1 TABLET BY MOUTH AT BEDTIME. 90 tablet 1  . venlafaxine XR (EFFEXOR-XR) 150 MG 24 hr capsule Take 150 mg by mouth daily with breakfast.    . hydrochlorothiazide (HYDRODIURIL) 25 MG tablet Take 1 tablet (25 mg total) by mouth daily. (Patient taking differently: Take 25 mg by mouth daily. ON HOLD UNTIL NEXT PCP VISIT 06-05-2014) 90 tablet 3     No results found for this or any previous visit (from the past 48 hour(s)). No results found.  Review of Systems  Constitutional: Negative.   HENT: Negative.   Eyes: Negative.   Respiratory: Negative.   Cardiovascular: Negative.   Gastrointestinal: Negative.   Genitourinary: Negative.   Musculoskeletal: Positive for joint pain.  Skin: Negative.   Neurological: Negative.   Endo/Heme/Allergies: Negative.   Psychiatric/Behavioral: Negative.     Blood pressure 148/96, pulse 86, temperature 98.4 F (36.9 C), temperature source Oral, resp. rate 16, height 5' 0.5" (1.537 m), weight 76.204 kg (168 lb), SpO2 100 %. Physical Exam  Constitutional: She is oriented to person, place, and time. She appears well-developed.  HENT:  Head: Normocephalic.  Eyes: EOM are normal.  Neck: Normal range of motion.  Cardiovascular: Normal rate, normal heart sounds and intact distal pulses.   Respiratory: Effort normal.  GI: Soft. Bowel sounds are normal.  Genitourinary:  Deferred  Musculoskeletal:  Left shoulder pain with ROM  Neurological: She is alert and oriented to person, place, and time.  Skin: Skin is warm and dry.  Psychiatric: Her behavior is normal.     Assessment/Plan Left shoulder rotator cuff tear: Left shoulder scope as consented D/c home today F/u in office Follow instructions  [name] L 06/03/2014, 12:10 PM

## 2014-06-04 ENCOUNTER — Encounter (HOSPITAL_BASED_OUTPATIENT_CLINIC_OR_DEPARTMENT_OTHER): Payer: Self-pay | Admitting: Specialist

## 2014-06-04 NOTE — Op Note (Signed)
NAME:  Cathy Howell, Cathy Howell NO.:  [id number]  MEDICAL RECORD NO.:  [id number]  LOCATION:                                 FACILITY:  PHYSICIAN:  Cynda Familia, M.D.DATE OF BIRTH:  1955/05/31  DATE OF PROCEDURE:  06/03/2014 DATE OF DISCHARGE:  06/03/2014                              OPERATIVE REPORT   PREOPERATIVE DIAGNOSIS:  Left shoulder probable recurrent rotator cuff tear.  POSTOPERATIVE DIAGNOSIS:  Left shoulder high-grade partial recurrent rotator cuff tear.  PROCEDURE: 1. Left shoulder glenohumeral arthroscopy. 2. Arthroscopic subacromial debridement. 3. Arthroscopic rotator cuff repair.  SURGEON:  Cynda Familia, MD  ASSISTED BY:  Jerilynn Mages, PA-C.  ANESTHESIA:  Interscalene block, general.  BLOOD LOSS:  Less than 10 mL.  DRAINS:  None.  COMPLICATIONS:  None.  DISPOSITION:  PACU stable.  OPERATIVE DETAILS:  The patient and family was counseled in holding area.  Correct site was identified.  IV started, sedation given, block was administered.  On the way to the operating room IV Ancef was given. In the OR, placed upon the supine position.  General anesthesia, turned to a right lateral decubitus position properly padded and bumped.  Right shoulder examined, full range of motion and stable.  She was then prepped with DuraPrep and draped in a sterile fashion.  Overhead shoulder positioner was utilized at 30 degrees abduction and 10 degrees of forward flexion, 10 pounds longitudinal traction.  Time-out was done, confirmed the left side.  Posterior portal was created.  Arthroscope was placed into the glenohumeral joint.  Diagnostic arthroscopy revealed intact articular cartilage, glenohumeral ligaments and labrum and biceps.  There was what appeared to be a partial rotator cuff tear at the supraspinous anteriorly.  The supraspinous and infraspinous junction looked satisfactory.  It also was noted at this point in time, there  was dull fluid extravasated from the joint consistent with that was no complete tear.  I attempted to view this from the subacromial region, my lateral port was established.  Neurovascular structures were protected including axillary nerve and subacromial debridement and bursectomy and scar tissue removal was performed.  The old sutures from the previous repair were intact.  A rotator cuff in the bursal surface did not show a complete tear at all.  An 18-gauge spinal needle was placed to the soft area, then placed the scope back into the joint and I corresponded with the high-grade partial rotator cuff tear, which appeared to be approximately 70-80%.  Based upon that, shaver was induced.  The fibers were debrided down to bleeding tissue.  We used the [employer] system.  Needle was placed percutaneously anteriorly into healthy rotator cuff tissue, dilated and then placed a PushLock anchor.  Another suture was then placed more posteriorly percutaneously and tapped and punched into position.  This gave 2 nice anchors bringing the arch or cable of the rotator cuff back down.  I had also prepared this around the greater tuberosity by removing the soft tissue cautery and then burring down to bleeding bone with the bur.  After preparation and suture placement, attention was directed to the subacromial region, 2 suture grasped, knot was tied, pulled down nicely, closing down the  partial defect.  Arm was then abducted into a SwiveLock anchor for a Tour manager.  At this point in time, we had excellent repair, rotator cuff down to bleeding bone.  The cuff looked satisfactorily, although there was some tendinopathy as they felt this was satisfactory, she did have a good result.  Irrigant and arthroscope were removed. Taken out of traction, normal pulses.  Portals closed with 4-0 nylon suture, 10 mL of Sensorcaine placed in skin, subacromial region. Sterile dressing applied to the shoulder,  turned supine, placed in a shoulder abduction sling and awakened, taken from the operating room to PACU in stable condition.  To help the patient's positioning, prepping, draping, technical and surgical assistance throughout entire case, wound closure, application of dressing sling, Mr. Wyatt Portela, PA assistance was needed.          ______________________________ Cynda Familia, M.D.     RAC/MEDQ  D:  06/03/2014  T:  06/04/2014  Job:  [id number]

## 2014-06-05 ENCOUNTER — Ambulatory Visit (INDEPENDENT_AMBULATORY_CARE_PROVIDER_SITE_OTHER): Payer: 59 | Admitting: Family Medicine

## 2014-06-05 ENCOUNTER — Encounter: Payer: Self-pay | Admitting: Family Medicine

## 2014-06-05 VITALS — BP 112/82 | HR 100 | Temp 98.3°F | Resp 20 | Wt 173.0 lb

## 2014-06-05 DIAGNOSIS — B379 Candidiasis, unspecified: Secondary | ICD-10-CM | POA: Diagnosis not present

## 2014-06-05 DIAGNOSIS — I10 Essential (primary) hypertension: Secondary | ICD-10-CM | POA: Diagnosis not present

## 2014-06-05 MED ORDER — FLUCONAZOLE 150 MG PO TABS: 150.0000 mg | ORAL_TABLET | Freq: Once | ORAL | Status: DC

## 2014-06-05 MED ORDER — METOPROLOL TARTRATE 25 MG PO TABS: 25.0000 mg | ORAL_TABLET | Freq: Two times a day (BID) | ORAL | Status: DC

## 2014-06-05 NOTE — Progress Notes (Signed)
Subjective:    Patient ID: Cathy Howell, female    DOB: November 30, 1955, 59 y.o.   MRN: [id number]  HPI Was recently placed on Effexor XR for anxiety 150 mg poqam.  Also, last week she was found to have elevated BP's.  She is here today for follow up of both of these issues.  Patient does not feel that the Effexor is helping her.  . She is currently out of work due to her recent surgery and she questions whether she needs the medicine at all anyway. Furthermore her blood pressure seems to have increased since we have started the Effexor. She states that her blood pressures running 150/100 at home with heart rates around 100 today her blood pressure is 112/82 but her pulse rate is 100 bpm. She recently had surgery on her left shoulder this week. She was started on antibiotics for the surgery. After that she developed a yeast infection. She reports perineal itching. She reports vaginal discharge. She denies any dysuria.. Past Medical History  Diagnosis Date  . Arthritis   . Hypertension   . Hyperlipidemia   . Depression   . Generalized anxiety disorder   . GERD (gastroesophageal reflux disease)   . Left rotator cuff tear   . Acute bronchitis     dx 05-22-2014---  per pt resolved no productive cough as of 05-30-2014  . History of diabetes mellitus     BORDERLINE PRIOR TO GASTRIC BAND--  NO LONGER ISSUE  . Wears glasses   . Hypothyroidism, postsurgical    Past Surgical History  Procedure Laterality Date  . Total hip arthroplasty Bilateral right 08-21-2006/   left  2005  . Thyroidectomy Bilateral 2007    benign mass  . Laparoscopic gastric banding  06/13/2011    Procedure: LAPAROSCOPIC GASTRIC BANDING;  Surgeon: Edward Jolly, MD;  Location: [facility];  Service: General;  Laterality: N/A;  . Hiatal hernia repair  06/13/2011    Procedure: LAPAROSCOPIC REPAIR OF HIATAL HERNIA;  Surgeon: Edward Jolly, MD;  Location: [facility];  Service: General;;  . Left shoulder arthroscopy/  sad/   acromioplasty/  bursectomy/  ca ligament release/  dcr/  rotator cuff repair  09-21-2006  . Tubal ligation  1996  . Tonsillectomy  1960's  . Shoulder arthroscopy with rotator cuff repair Left 06/03/2014    Procedure: LEFT SHOULDER ARTHROSCOPY WITH ROTATOR CUFF REPAIR;  Surgeon: Sydnee Cabal, MD;  Location: [facility];  Service: Orthopedics;  Laterality: Left;   Current Outpatient Prescriptions on File Prior to Visit  Medication Sig Dispense Refill  . ALPRAZolam (XANAX) 0.5 MG tablet Take 0.5 mg by mouth 3 (three) times daily as needed for anxiety.    . Biotin 10 MG TABS Take 1 tablet by mouth daily. Equals 10,000 mcg    . cephALEXin (KEFLEX) 500 MG capsule Take 1 capsule (500 mg total) by mouth 3 (three) times daily. 12 capsule 0  . docusate sodium (COLACE) 100 MG capsule Take 100 mg by mouth 2 (two) times daily.    . hydrochlorothiazide (HYDRODIURIL) 25 MG tablet Take 1 tablet (25 mg total) by mouth daily. (Patient taking differently: Take 25 mg by mouth daily. ON HOLD UNTIL NEXT PCP VISIT 06-05-2014) 90 tablet 3  . ibuprofen (ADVIL,MOTRIN) 800 MG tablet Take 800 mg by mouth every 8 (eight) hours as needed.    Marland Kitchen levothyroxine (SYNTHROID, LEVOTHROID) 88 MCG tablet Take 1 tablet (88 mcg total) by mouth daily. (Patient taking differently: Take 66  mcg by mouth daily before breakfast. ) 90 tablet 1  . methocarbamol (ROBAXIN) 500 MG tablet Take 1 tablet (500 mg total) by mouth every 6 (six) hours as needed. 50 tablet 2  . Multiple Minerals-Vitamins (CALCIUM CITRATE PLUS/MAGNESIUM PO) Take 2 each by mouth 2 (two) times daily. 600mg /1500iu/120mg     . Multiple Vitamin (MULTIVITAMIN) tablet Take 1 tablet by mouth daily.    Marland Kitchen oxyCODONE-acetaminophen (ROXICET) 5-325 MG per tablet Take 1-2 tablets by mouth every 4 (four) hours as needed for severe pain. 60 tablet 0  . pantoprazole (PROTONIX) 40 MG tablet Take 1 tablet (40 mg total) by mouth daily. (Patient taking differently: Take 40 mg by  mouth as needed. ) 30 tablet 11  . simvastatin (ZOCOR) 10 MG tablet TAKE 1 TABLET BY MOUTH AT BEDTIME. 90 tablet 1  . venlafaxine XR (EFFEXOR-XR) 150 MG 24 hr capsule Take 150 mg by mouth daily with breakfast.     No current facility-administered medications on file prior to visit.   No Known Allergies History   Social History  . Marital Status: Married    Spouse Name: N/A  . Number of Children: N/A  . Years of Education: N/A   Occupational History  . Not on file.   Social History Main Topics  . Smoking status: Former Smoker -- 1.00 packs/day for 15 years    Types: Cigarettes    Quit date: 02/22/1992  . Smokeless tobacco: Never Used  . Alcohol Use: No  . Drug Use: No  . Sexual Activity: Not on file   Other Topics Concern  . Not on file   Social History Narrative      Review of Systems  All other systems reviewed and are negative.      Objective:   Physical Exam  Cardiovascular: Normal rate, regular rhythm and normal heart sounds.   Pulmonary/Chest: Effort normal and breath sounds normal. No respiratory distress. She has no wheezes. She has no rales.  Abdominal: Soft. Bowel sounds are normal.  Vitals reviewed.         Assessment & Plan:  Yeast infection - Plan: fluconazole (DIFLUCAN) 150 MG tablet  Benign essential HTN - Plan: metoprolol tartrate (LOPRESSOR) 25 MG tablet  Begin Diflucan 150 mg by mouth daily 1. I've also asked the patient to increase her Lopressor to 25 mg by mouth twice a day and was recheck blood pressure in 2 weeks. Patient's Effexor does not seem to be helping. Furthermore she states that she doesn't really feel like she needs the medication anyway. Therefore I would like to try to wean her off of this over the next 2 weeks. Take 150 mg every other day for 7 days. At the end of 7 days I will her to start taking 150 mg every 72 hours for 7 days and then discontinue the medication altogether. Recheck in 2 weeks

## 2014-06-25 ENCOUNTER — Other Ambulatory Visit (HOSPITAL_COMMUNITY): Payer: Self-pay | Admitting: Plastic Surgery

## 2014-06-26 ENCOUNTER — Encounter (HOSPITAL_COMMUNITY): Payer: Self-pay

## 2014-06-26 ENCOUNTER — Encounter (HOSPITAL_COMMUNITY)
Admission: RE | Admit: 2014-06-26 | Discharge: 2014-06-26 | Disposition: A | Discharge status: Home / Self Care | Payer: 59 | Source: Ambulatory Visit | Attending: Plastic Surgery | Admitting: Plastic Surgery

## 2014-06-26 DIAGNOSIS — I10 Essential (primary) hypertension: Secondary | ICD-10-CM | POA: Diagnosis not present

## 2014-06-26 DIAGNOSIS — E119 Type 2 diabetes mellitus without complications: Secondary | ICD-10-CM | POA: Diagnosis not present

## 2014-06-26 DIAGNOSIS — K219 Gastro-esophageal reflux disease without esophagitis: Secondary | ICD-10-CM | POA: Diagnosis not present

## 2014-06-26 DIAGNOSIS — Z6831 Body mass index (BMI) 31.0-31.9, adult: Secondary | ICD-10-CM | POA: Diagnosis not present

## 2014-06-26 DIAGNOSIS — G4700 Insomnia, unspecified: Secondary | ICD-10-CM | POA: Diagnosis not present

## 2014-06-26 DIAGNOSIS — Z96649 Presence of unspecified artificial hip joint: Secondary | ICD-10-CM | POA: Diagnosis not present

## 2014-06-26 DIAGNOSIS — Z886 Allergy status to analgesic agent status: Secondary | ICD-10-CM | POA: Diagnosis not present

## 2014-06-26 DIAGNOSIS — H0289 Other specified disorders of eyelid: Secondary | ICD-10-CM | POA: Diagnosis not present

## 2014-06-26 DIAGNOSIS — H02831 Dermatochalasis of right upper eyelid: Secondary | ICD-10-CM | POA: Diagnosis not present

## 2014-06-26 DIAGNOSIS — Z87891 Personal history of nicotine dependence: Secondary | ICD-10-CM | POA: Diagnosis not present

## 2014-06-26 DIAGNOSIS — Z9851 Tubal ligation status: Secondary | ICD-10-CM | POA: Diagnosis not present

## 2014-06-26 DIAGNOSIS — E785 Hyperlipidemia, unspecified: Secondary | ICD-10-CM | POA: Diagnosis not present

## 2014-06-26 DIAGNOSIS — H02834 Dermatochalasis of left upper eyelid: Secondary | ICD-10-CM | POA: Diagnosis present

## 2014-06-26 DIAGNOSIS — E039 Hypothyroidism, unspecified: Secondary | ICD-10-CM | POA: Diagnosis not present

## 2014-06-26 HISTORY — DX: Type 2 diabetes mellitus without complications: E11.9

## 2014-06-26 LAB — BASIC METABOLIC PANEL
Anion gap: 10 (ref 5–15)
BUN: 6 mg/dL (ref 6–20)
CO2: 26 mmol/L (ref 22–32)
Calcium: 9 mg/dL (ref 8.9–10.3)
Chloride: 106 mmol/L (ref 101–111)
Creatinine, Ser: 0.84 mg/dL (ref 0.44–1.00)
GFR calc Af Amer: >60 mL/min (ref >60)
GFR calc non Af Amer: >60 mL/min (ref >60)
GLUCOSE: 124 mg/dL — AB (ref 70–99)
POTASSIUM: 3.3 mmol/L — AB (ref 3.5–5.1)
Sodium: 142 mmol/L (ref 135–145)

## 2014-06-26 LAB — CBC
HEMATOCRIT: 38.7 % (ref 36.0–46.0)
Hemoglobin: 12.6 g/dL (ref 12.0–15.0)
MCH: 27.6 pg (ref 26.0–34.0)
MCHC: 32.6 g/dL (ref 30.0–36.0)
MCV: 84.9 fL (ref 78.0–100.0)
Platelets: 297 10*3/uL (ref 150–400)
RBC: 4.56 MIL/uL (ref 3.87–5.11)
RDW: 15.4 % (ref 11.5–15.5)
WBC: 12.5 10*3/uL — AB (ref 4.0–10.5)

## 2014-06-26 NOTE — Pre-Procedure Instructions (Signed)
Cathy Howell  06/26/2014   Your procedure is scheduled on: Thursday, Jul 03, 2014  Report to [facility] Admitting at 5:30 AM.  Call this number if you have problems the morning of surgery: [phone]   Remember:   Do not eat food or drink liquids after midnight Wednesday, Jul 02, 2014   Take these medicines the morning of surgery with A SIP OF WATER: levothyroxine (SYNTHROID, LEVOTHROID), metoprolol tartrate (LOPRESSOR), pantoprazole (PROTONIX)  Stop taking Aspirin, vitamins and herbal medications. Do not take any NSAIDs ie: Ibuprofen, Advil, Naproxen or any medication containing Aspirin; stop now.   Do not wear jewelry, make-up or nail polish.  Do not wear lotions, powders, or perfumes. You may not wear deodorant.  Do not shave 48 hours prior to surgery.   Do not bring valuables to the hospital.  [facility] is not responsible  for any belongings or valuables.               Contacts, dentures or bridgework may not be worn into surgery.  Leave suitcase in the car. After surgery it may be brought to your room.  For patients admitted to the hospital, discharge time is determined by your treatment team.               Patients discharged the day of surgery will not be allowed to drive home.  Name and phone number of your driver:   Special Instructions:  Special Instructions:Special Instructions: [facility] - Preparing for Surgery  Before surgery, you can play an important role.  Because skin is not sterile, your skin needs to be as free of germs as possible.  You can reduce the number of germs on you skin by washing with CHG (chlorahexidine gluconate) soap before surgery.  CHG is an antiseptic cleaner which kills germs and bonds with the skin to continue killing germs even after washing.  Please DO NOT use if you have an allergy to CHG or antibacterial soaps.  If your skin becomes reddened/irritated stop using the CHG and inform your nurse when you arrive at [facility]  Stay.  Do not shave (including legs and underarms) for at least 48 hours prior to the first CHG shower.  You may shave your face.  Please follow these instructions carefully:   1.  Shower with CHG Soap the night before surgery and the morning of Surgery.  2.  If you choose to wash your hair, wash your hair first as usual with your normal shampoo.  3.  After you shampoo, rinse your hair and body thoroughly to remove the Shampoo.  4.  Use CHG as you would any other liquid soap.  You can apply chg directly  to the skin and wash gently with scrungie or a clean washcloth.  5.  Apply the CHG Soap to your body ONLY FROM THE NECK DOWN.  Do not use on open wounds or open sores.  Avoid contact with your eyes, ears, mouth and genitals (private parts).  Wash genitals (private parts) with your normal soap.  6.  Wash thoroughly, paying special attention to the area where your surgery will be performed.  7.  Thoroughly rinse your body with warm water from the neck down.  8.  DO NOT shower/wash with your normal soap after using and rinsing off the CHG Soap.  9.  Pat yourself dry with a clean towel.            10.  Wear clean pajamas.  11.  Place clean sheets on your bed the night of your first shower and do not sleep with pets.  Day of Surgery  Do not apply any lotions/deodorants the morning of surgery.  Please wear clean clothes to the hospital/surgery center.   Please read over the following fact sheets that you were given: Pain Booklet, Coughing and Deep Breathing and Surgical Site Infection Prevention

## 2014-06-26 NOTE — Progress Notes (Signed)
   06/26/14 1133  OBSTRUCTIVE SLEEP APNEA  Have you ever been diagnosed with sleep apnea through a sleep study? No  Do you snore loudly (loud enough to be heard through closed doors)?  0  Do you often feel tired, fatigued, or sleepy during the daytime? 0  Has anyone observed you stop breathing during your sleep? 0  Do you have, or are you being treated for high blood pressure? 1  BMI more than 35 kg/m2? 0  Age over 58 years old? 1  Neck circumference greater than 40 cm/16 inches? 0  Gender: 0

## 2014-07-02 MED ORDER — HEPARIN SODIUM (PORCINE) 5000 UNIT/ML IJ SOLN
5000.0000 [IU] | INTRAMUSCULAR | Status: AC
Start: 2014-07-03 — End: 2014-07-03
  Administered 2014-07-03: 5000 [IU] via SUBCUTANEOUS
  Filled 2014-07-02: qty 1

## 2014-07-03 ENCOUNTER — Encounter (HOSPITAL_COMMUNITY)
Admission: RE | Disposition: A | Discharge status: Home / Self Care | Payer: Self-pay | Source: Ambulatory Visit | Attending: Plastic Surgery

## 2014-07-03 ENCOUNTER — Ambulatory Visit (HOSPITAL_COMMUNITY): Payer: 59 | Admitting: Critical Care Medicine

## 2014-07-03 ENCOUNTER — Encounter (HOSPITAL_COMMUNITY): Payer: Self-pay | Admitting: Critical Care Medicine

## 2014-07-03 ENCOUNTER — Ambulatory Visit (HOSPITAL_COMMUNITY): Payer: 59 | Admitting: Anesthesiology

## 2014-07-03 ENCOUNTER — Ambulatory Visit (HOSPITAL_COMMUNITY)
Admission: RE | Admit: 2014-07-03 | Discharge: 2014-07-03 | Disposition: A | Discharge status: Home / Self Care | Payer: 59 | Source: Ambulatory Visit | Attending: Plastic Surgery | Admitting: Plastic Surgery

## 2014-07-03 DIAGNOSIS — Z6831 Body mass index (BMI) 31.0-31.9, adult: Secondary | ICD-10-CM | POA: Insufficient documentation

## 2014-07-03 DIAGNOSIS — Z87891 Personal history of nicotine dependence: Secondary | ICD-10-CM | POA: Insufficient documentation

## 2014-07-03 DIAGNOSIS — E119 Type 2 diabetes mellitus without complications: Secondary | ICD-10-CM | POA: Insufficient documentation

## 2014-07-03 DIAGNOSIS — H02834 Dermatochalasis of left upper eyelid: Secondary | ICD-10-CM | POA: Diagnosis not present

## 2014-07-03 DIAGNOSIS — G4700 Insomnia, unspecified: Secondary | ICD-10-CM | POA: Insufficient documentation

## 2014-07-03 DIAGNOSIS — K219 Gastro-esophageal reflux disease without esophagitis: Secondary | ICD-10-CM | POA: Insufficient documentation

## 2014-07-03 DIAGNOSIS — E039 Hypothyroidism, unspecified: Secondary | ICD-10-CM | POA: Insufficient documentation

## 2014-07-03 DIAGNOSIS — H02831 Dermatochalasis of right upper eyelid: Secondary | ICD-10-CM | POA: Insufficient documentation

## 2014-07-03 DIAGNOSIS — Z9851 Tubal ligation status: Secondary | ICD-10-CM | POA: Insufficient documentation

## 2014-07-03 DIAGNOSIS — Z96649 Presence of unspecified artificial hip joint: Secondary | ICD-10-CM | POA: Insufficient documentation

## 2014-07-03 DIAGNOSIS — I10 Essential (primary) hypertension: Secondary | ICD-10-CM | POA: Insufficient documentation

## 2014-07-03 DIAGNOSIS — Z886 Allergy status to analgesic agent status: Secondary | ICD-10-CM | POA: Insufficient documentation

## 2014-07-03 DIAGNOSIS — H0289 Other specified disorders of eyelid: Secondary | ICD-10-CM | POA: Insufficient documentation

## 2014-07-03 DIAGNOSIS — E785 Hyperlipidemia, unspecified: Secondary | ICD-10-CM | POA: Insufficient documentation

## 2014-07-03 HISTORY — PX: BROW LIFT: SHX178

## 2014-07-03 HISTORY — PX: MASS EXCISION: SHX2000

## 2014-07-03 LAB — GLUCOSE, CAPILLARY: Glucose-Capillary: 87 mg/dL (ref 65–99)

## 2014-07-03 SURGERY — BLEPHAROPLASTY
Anesthesia: Monitor Anesthesia Care | Site: Eye | Laterality: Left

## 2014-07-03 MED ORDER — MEPERIDINE HCL 25 MG/ML IJ SOLN: 6.2500 mg | INTRAMUSCULAR | Status: DC | PRN

## 2014-07-03 MED ORDER — LIDOCAINE-EPINEPHRINE 1 %-1:100000 IJ SOLN
INTRAMUSCULAR | Status: AC
  Filled 2014-07-03: qty 1

## 2014-07-03 MED ORDER — PHENYLEPHRINE HCL 10 MG/ML IJ SOLN
INTRAMUSCULAR | Status: DC | PRN
  Administered 2014-07-03: 80 ug via INTRAVENOUS

## 2014-07-03 MED ORDER — BSS IO SOLN
INTRAOCULAR | Status: DC | PRN
  Administered 2014-07-03: 15 mL via INTRAOCULAR

## 2014-07-03 MED ORDER — PROPOFOL INFUSION 10 MG/ML OPTIME
INTRAVENOUS | Status: DC | PRN
  Administered 2014-07-03: 50 ug/kg/min via INTRAVENOUS

## 2014-07-03 MED ORDER — BACITRACIN-NEOMYCIN-POLYMYXIN 400-5-5000 EX OINT
TOPICAL_OINTMENT | CUTANEOUS | Status: AC
  Filled 2014-07-03: qty 1

## 2014-07-03 MED ORDER — FENTANYL CITRATE (PF) 100 MCG/2ML IJ SOLN
INTRAMUSCULAR | Status: AC
  Filled 2014-07-03: qty 2

## 2014-07-03 MED ORDER — FENTANYL CITRATE (PF) 250 MCG/5ML IJ SOLN
INTRAMUSCULAR | Status: AC
  Filled 2014-07-03: qty 5

## 2014-07-03 MED ORDER — LIDOCAINE-EPINEPHRINE 0.5 %-1:200000 IJ SOLN
INTRAMUSCULAR | Status: AC
  Filled 2014-07-03: qty 1

## 2014-07-03 MED ORDER — ROCURONIUM BROMIDE 50 MG/5ML IV SOLN
INTRAVENOUS | Status: AC
  Filled 2014-07-03: qty 1

## 2014-07-03 MED ORDER — ONDANSETRON HCL 4 MG/2ML IJ SOLN
INTRAMUSCULAR | Status: DC | PRN
Start: 2014-07-03 — End: 2014-07-03
  Administered 2014-07-03: 4 mg via INTRAVENOUS

## 2014-07-03 MED ORDER — EPHEDRINE SULFATE 50 MG/ML IJ SOLN
INTRAMUSCULAR | Status: AC
  Filled 2014-07-03: qty 1

## 2014-07-03 MED ORDER — LIDOCAINE HCL (CARDIAC) 20 MG/ML IV SOLN
INTRAVENOUS | Status: AC
  Filled 2014-07-03: qty 5

## 2014-07-03 MED ORDER — MIDAZOLAM HCL 5 MG/5ML IJ SOLN
INTRAMUSCULAR | Status: DC | PRN
  Administered 2014-07-03: 2 mg via INTRAVENOUS

## 2014-07-03 MED ORDER — SUCCINYLCHOLINE CHLORIDE 20 MG/ML IJ SOLN
INTRAMUSCULAR | Status: AC
  Filled 2014-07-03: qty 1

## 2014-07-03 MED ORDER — PROPOFOL 10 MG/ML IV BOLUS
INTRAVENOUS | Status: DC | PRN
  Administered 2014-07-03 (×2): 10 mg via INTRAVENOUS

## 2014-07-03 MED ORDER — ONDANSETRON HCL 4 MG/2ML IJ SOLN
INTRAMUSCULAR | Status: AC
  Filled 2014-07-03: qty 2

## 2014-07-03 MED ORDER — LACTATED RINGERS IV SOLN
INTRAVENOUS | Status: DC | PRN
  Administered 2014-07-03: 07:00:00 via INTRAVENOUS

## 2014-07-03 MED ORDER — FENTANYL CITRATE (PF) 100 MCG/2ML IJ SOLN
25.0000 ug | INTRAMUSCULAR | Status: DC | PRN
  Administered 2014-07-03 (×2): 25 ug via INTRAVENOUS

## 2014-07-03 MED ORDER — PROPOFOL 10 MG/ML IV BOLUS
INTRAVENOUS | Status: AC
  Filled 2014-07-03: qty 20

## 2014-07-03 MED ORDER — MIDAZOLAM HCL 2 MG/2ML IJ SOLN
INTRAMUSCULAR | Status: AC
  Filled 2014-07-03: qty 2

## 2014-07-03 MED ORDER — BSS IO SOLN
INTRAOCULAR | Status: AC
  Filled 2014-07-03: qty 15

## 2014-07-03 MED ORDER — SODIUM CHLORIDE 0.9 % IR SOLN
Status: DC | PRN
  Administered 2014-07-03: 1000 mL

## 2014-07-03 MED ORDER — SODIUM CHLORIDE 0.9 % IJ SOLN
INTRAMUSCULAR | Status: AC
  Filled 2014-07-03: qty 10

## 2014-07-03 MED ORDER — ARTIFICIAL TEARS OP OINT
TOPICAL_OINTMENT | OPHTHALMIC | Status: AC
  Filled 2014-07-03: qty 3.5

## 2014-07-03 MED ORDER — LIDOCAINE-EPINEPHRINE 1 %-1:100000 IJ SOLN
INTRAMUSCULAR | Status: DC | PRN
  Administered 2014-07-03 (×2): 2 mL

## 2014-07-03 MED ORDER — FENTANYL CITRATE (PF) 100 MCG/2ML IJ SOLN
INTRAMUSCULAR | Status: DC | PRN
  Administered 2014-07-03: 50 ug via INTRAVENOUS
  Administered 2014-07-03 (×2): 25 ug via INTRAVENOUS

## 2014-07-03 SURGICAL SUPPLY — 47 items
APPLICATOR COTTON TIP 6IN STRL (MISCELLANEOUS) ×6 IMPLANT
BLADE 10 SAFETY STRL DISP (BLADE) IMPLANT
BLADE SURG 15 STRL LF DISP TIS (BLADE) ×2 IMPLANT
BLADE SURG 15 STRL SS (BLADE) ×1
BNDG GAUZE ELAST 4 BULKY (GAUZE/BANDAGES/DRESSINGS) IMPLANT
CANISTER SUCTION 2500CC (MISCELLANEOUS) ×3 IMPLANT
CLEANER TIP ELECTROSURG 2X2 (MISCELLANEOUS) ×3 IMPLANT
COVER SURGICAL LIGHT HANDLE (MISCELLANEOUS) ×3 IMPLANT
DECANTER SPIKE VIAL GLASS SM (MISCELLANEOUS) ×3 IMPLANT
DRAPE UTILITY W/TAPE 26X15 (DRAPES) ×3 IMPLANT
ELECT NEEDLE TIP 2.8 STRL (NEEDLE) ×3 IMPLANT
ELECT REM PT RETURN 9FT ADLT (ELECTROSURGICAL) ×3
ELECTRODE REM PT RTRN 9FT ADLT (ELECTROSURGICAL) ×2 IMPLANT
GAUZE SPONGE 4X4 12PLY STRL (GAUZE/BANDAGES/DRESSINGS) IMPLANT
GAUZE XEROFORM 1X8 LF (GAUZE/BANDAGES/DRESSINGS) ×3 IMPLANT
GLOVE BIO SURGEON STRL SZ7 (GLOVE) ×3 IMPLANT
GLOVE BIO SURGEON STRL SZ7.5 (GLOVE) ×3 IMPLANT
GLOVE BIOGEL PI IND STRL 6.5 (GLOVE) ×2 IMPLANT
GLOVE BIOGEL PI IND STRL 8 (GLOVE) ×2 IMPLANT
GLOVE BIOGEL PI INDICATOR 6.5 (GLOVE) ×1
GLOVE BIOGEL PI INDICATOR 8 (GLOVE) ×1
GOWN STRL REUS W/ TWL LRG LVL3 (GOWN DISPOSABLE) ×4 IMPLANT
GOWN STRL REUS W/ TWL XL LVL3 (GOWN DISPOSABLE) ×2 IMPLANT
GOWN STRL REUS W/TWL LRG LVL3 (GOWN DISPOSABLE) ×2
GOWN STRL REUS W/TWL XL LVL3 (GOWN DISPOSABLE) ×1
KIT BASIN OR (CUSTOM PROCEDURE TRAY) ×3 IMPLANT
KIT ROOM TURNOVER OR (KITS) ×3 IMPLANT
MARKER SKIN DUAL TIP RULER LAB (MISCELLANEOUS) ×3 IMPLANT
NEEDLE 27GAX1X1/2 (NEEDLE) ×3 IMPLANT
NEEDLE HYPO 30X.5 LL (NEEDLE) ×3 IMPLANT
NS IRRIG 1000ML POUR BTL (IV SOLUTION) ×3 IMPLANT
PAD ARMBOARD 7.5X6 YLW CONV (MISCELLANEOUS) ×6 IMPLANT
PENCIL BUTTON HOLSTER BLD 10FT (ELECTRODE) ×3 IMPLANT
SPONGE LAP 18X18 X RAY DECT (DISPOSABLE) ×3 IMPLANT
SUT PDS AB 5-0 P3 18 (SUTURE) IMPLANT
SUT PROLENE 4 0 P 3 18 (SUTURE) IMPLANT
SUT PROLENE 6 0 CC (SUTURE) ×3 IMPLANT
SUT PROLENE 6 0 P 1 18 (SUTURE) IMPLANT
SUT SILK 4 0 PS 2 (SUTURE) IMPLANT
SUT VIC AB 4-0 P-3 18X BRD (SUTURE) IMPLANT
SUT VIC AB 4-0 P3 18 (SUTURE)
TOWEL OR 17X24 6PK STRL BLUE (TOWEL DISPOSABLE) ×6 IMPLANT
TOWEL OR 17X26 10 PK STRL BLUE (TOWEL DISPOSABLE) ×3 IMPLANT
TRAY ENT MC OR (CUSTOM PROCEDURE TRAY) ×3 IMPLANT
TUBE CONNECTING 12X1/4 (SUCTIONS) ×3 IMPLANT
WATER STERILE IRR 1000ML POUR (IV SOLUTION) ×3 IMPLANT
YANKAUER SUCT BULB TIP NO VENT (SUCTIONS) ×3 IMPLANT

## 2014-07-03 NOTE — Brief Op Note (Signed)
07/03/2014  8:42 AM  PATIENT:  Cathy Howell  59 y.o. female  PRE-OPERATIVE DIAGNOSIS:  DERMATOCHALASIS BOTH EYE LIDS  POST-OPERATIVE DIAGNOSIS:  DERMATOCHALASIS BOTH EYE LIDS  PROCEDURE:  Procedure(s): BILATERAL UPPER LEFT BLEPHAROPLASTY (Bilateral) EXCISION LESION ON UPPER LEFT EYE LID (Left)  SURGEON:  Surgeon(s) and Role:    * Crissie Reese, MD - Primary  PHYSICIAN ASSISTANT:   ASSISTANTS: none   ANESTHESIA:   IV sedation  EBL:  Total I/O In: 700 [I.V.:700] Out: 5 [Blood:5]  BLOOD ADMINISTERED:none  DRAINS: none   LOCAL MEDICATIONS USED:  LIDOCAINE   SPECIMEN:  No Specimen  DISPOSITION OF SPECIMEN:  N/A  COUNTS:  YES  TOURNIQUET:  * No tourniquets in log *  DICTATION: .Other Dictation: Dictation Number [id number]  PLAN OF CARE: Discharge to home after PACU  PATIENT DISPOSITION:  PACU - hemodynamically stable.   Delay start of Pharmacological VTE agent (>24hrs) due to surgical blood loss or risk of bleeding: not applicable

## 2014-07-03 NOTE — Op Note (Signed)
NAME:  Cathy Howell, Cathy Howell NO.:  [id number]  MEDICAL RECORD NO.:  [id number]  LOCATION:  [facility]                         FACILITY:  [facility]  PHYSICIAN:  Crissie Reese, M.D.     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 Skin lesion undetermined behavior left upper eyelid.  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  PROCEDURE:  Bilateral upper lid blepharoplasty.             Excision of skin lesion left upper eyelid  SURGEON:  Crissie Reese, M.D.  ANESTHESIA:  MAC and 1% Xylocaine with epinephrine as local.  CLINICAL NOTE:  A 59 year old woman complains of excess skin of her upper eyelids and has documentation for obstruction in the superior visual field on visual field testing by her ophthalmologist.  She desired bilateral upper lid blepharoplasty.  Next, the procedure and risks were discussed with her in detail.  She understood the risks include but not limited to, bleeding, infection, healing problems, scarring, loss of sensation, fluid accumulation, anesthesia complications, damage to the underlying muscles including the muscle that raises in lower of the eyelid as well as muscles that move the eye and also blurred vision and blindness.  She also understood there was a risk of asymmetry and overall disappointment.  She wished to proceed.  DESCRIPTION OF PROCEDURE:  The patient was marked in the holding area in a full upright position.  She was taken to the operating room and placed supine under satisfactory sedation anesthesia.  She was prepped with Betadine and draped with sterile drapes.  The markings were placed very carefully, 7 mm up from the inframammary crease and then using a  pinch, forceps test, the amount to be excised was carefully marked and rechecked.  Great care was taken to avoid the excision of too much skin. The markings had been placed, 1% Xylocaine with epinephrine local was placed and the skin excisions were then performed.  Hemostasis with electrocautery.  A strip of orbicularis oculi muscle was removed along the superior border of the wound in order to avoid the area of the levator mechanism.  Again, irrigation with BSS and meticulous hemostasis with electrocautery.  The excellent hemostasis having been confirmed, the closure with a 6-0 Prolene running subcuticular suture and a couple of 6-0 Prolene simple interrupted sutures, 1 medial and 1 lateral as reinforcement.  Vaseline ointment applied.  A small skin lesion was also removed from the left upper lid as well and this required only Vaseline ointment.  She was then transferred to the recovery room stable having tolerated the procedure well.     Crissie Reese, M.D.     DB/MEDQ  D:  07/03/2014  T:  07/03/2014  Job:  [id number]  cc:   Crissie Reese, M.D.

## 2014-07-03 NOTE — Anesthesia Procedure Notes (Signed)
Procedure Name: MAC Date/Time: 07/03/2014 7:25 AM Performed by: Merrilyn Puma B Pre-anesthesia Checklist: Patient identified, Timeout performed, Emergency Drugs available, Suction available and Patient being monitored Patient Re-evaluated:Patient Re-evaluated prior to inductionOxygen Delivery Method: Nasal cannula Intubation Type: IV induction Placement Confirmation: positive ETCO2 and breath sounds checked- equal and bilateral Dental Injury: Teeth and Oropharynx as per pre-operative assessment

## 2014-07-03 NOTE — Transfer of Care (Signed)
Immediate Anesthesia Transfer of Care Note  Patient: Cathy Howell  Procedure(s) Performed: Procedure(s): BILATERAL UPPER LEFT BLEPHAROPLASTY (Bilateral) EXCISION LESION ON UPPER LEFT EYE LID (Left)  Patient Location: PACU  Anesthesia Type:MAC  Level of Consciousness: awake, alert  and oriented  Airway & Oxygen Therapy: Patient Spontanous Breathing  Post-op Assessment: Report given to RN, Post -op Vital signs reviewed and stable and Patient moving all extremities X 4  Post vital signs: Reviewed and stable  Last Vitals:  Filed Vitals:   07/03/14 0606  BP: 123/84  Pulse: 80  Temp: 37 C  Resp: 18    Complications: No apparent anesthesia complications

## 2014-07-03 NOTE — Anesthesia Preprocedure Evaluation (Signed)
Anesthesia Evaluation  Patient identified by MRN, date of birth, ID band Patient awake    Reviewed: Allergy & Precautions, NPO status , Patient's Chart, lab work & pertinent test results, reviewed documented beta blocker date and time   Airway Mallampati: I  TM Distance: >3 FB Neck ROM: Full    Dental  (+) Teeth Intact, Dental Advisory Given   Pulmonary former smoker,  breath sounds clear to auscultation        Cardiovascular hypertension, Pt. on medications and Pt. on home beta blockers Rhythm:Regular Rate:Normal     Neuro/Psych    GI/Hepatic GERD-  Medicated and Controlled,  Endo/Other  diabetes  Renal/GU      Musculoskeletal   Abdominal   Peds  Hematology   Anesthesia Other Findings   Reproductive/Obstetrics                             Anesthesia Physical Anesthesia Plan  ASA: II  Anesthesia Plan: MAC   Post-op Pain Management:    Induction: Intravenous  Airway Management Planned: Simple Face Mask  Additional Equipment:   Intra-op Plan:   Post-operative Plan:   Informed Consent: I have reviewed the patients History and Physical, chart, labs and discussed the procedure including the risks, benefits and alternatives for the proposed anesthesia with the patient or authorized representative who has indicated his/her understanding and acceptance.   Dental advisory given  Plan Discussed with: CRNA, Anesthesiologist and Surgeon  Anesthesia Plan Comments:         Anesthesia Quick Evaluation

## 2014-07-03 NOTE — H&P (Signed)
I have re-examined and re-evaluated the patient and there are no changes. See office notes in paper chart for H&P.  Planned Procedure: Bilateral upper lid blepharoplasty

## 2014-07-03 NOTE — Op Note (Deleted)
NAME:  Cathy Howell, Cathy Howell NO.:  [id number]  MEDICAL RECORD NO.:  [id number]  LOCATION:  [facility]                         FACILITY:  [facility]  PHYSICIAN:  Crissie Reese, M.D.     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  PROCEDURE:  Bilateral upper lid blepharoplasty.  SURGEON:  Crissie Reese, M.D.  ANESTHESIA:  MAC and 1% Xylocaine with epinephrine as local.  CLINICAL NOTE:  A 59 year old woman complains of excess skin of her upper eyelids and has documentation for obstruction in the superior visual field on visual field testing by her ophthalmologist.  She desired bilateral upper lid blepharoplasty.  Next, the procedure and risks were discussed with her in detail.  She understood the risks include but not limited to, bleeding, infection, healing problems, scarring, loss of sensation, fluid accumulation, anesthesia complications, damage to the underlying muscles including the muscle that raises in lower of the eyelid as well as muscles that move the eye and also blurred vision and blindness.  She also understood there was a risk of asymmetry and overall disappointment.  She wished to proceed.  DESCRIPTION OF PROCEDURE:  The patient was marked in the holding area in a full upright position.  She was taken to the operating room and placed supine under satisfactory sedation anesthesia.  She was prepped with Betadine and draped with sterile drapes.  The markings were placed very carefully, 7 mm up from the inframammary crease and then using a pinch, forceps test, the amount to be excised was carefully marked and rechecked.  Great care was taken to avoid the excision of too much skin. The markings had  been placed, 1% Xylocaine with epinephrine local was placed and the skin excisions were then performed.  Hemostasis with electrocautery.  A strip of orbicularis oculi muscle was removed along the superior border of the wound in order to avoid the area of the levator mechanism.  Again, irrigation with BSS and meticulous hemostasis with electrocautery.  The excellent hemostasis having been confirmed, the closure with a 6-0 Prolene running subcuticular suture and a couple of 6-0 Prolene simple interrupted sutures, 1 medial and 1 lateral as reinforcement.  Vaseline ointment applied.  A small skin lesion was also removed from the left upper lid as well and this required only Vaseline ointment.  She was then transferred to the recovery room stable having tolerated the procedure well.     Crissie Reese, M.D.     DB/MEDQ  D:  07/03/2014  T:  07/03/2014  Job:  [id number]  cc:   Crissie Reese, M.D.

## 2014-07-03 NOTE — Anesthesia Postprocedure Evaluation (Signed)
  Anesthesia Post-op Note  Patient: Cathy Howell  Procedure(s) Performed: Procedure(s): BILATERAL UPPER LEFT BLEPHAROPLASTY (Bilateral) EXCISION LESION ON UPPER LEFT EYE LID (Left)  Patient Location: PACU  Anesthesia Type:MAC  Level of Consciousness: awake and alert   Airway and Oxygen Therapy: Patient Spontanous Breathing  Post-op Pain: mild  Post-op Assessment: Post-op Vital signs reviewed  Post-op Vital Signs: Reviewed  Last Vitals:  Filed Vitals:   07/03/14 1030  BP: 135/77  Pulse: 80  Temp: 36.5 C  Resp: 14    Complications: No apparent anesthesia complications

## 2014-07-07 ENCOUNTER — Encounter (HOSPITAL_COMMUNITY): Payer: Self-pay | Admitting: Plastic Surgery

## 2014-07-11 ENCOUNTER — Other Ambulatory Visit: Payer: Self-pay | Admitting: Physician Assistant

## 2014-07-11 NOTE — Telephone Encounter (Signed)
Medication filled x1 with no refills.   Requires office visit before any further refills can be given.   Letter sent.  

## 2014-07-17 ENCOUNTER — Ambulatory Visit (INDEPENDENT_AMBULATORY_CARE_PROVIDER_SITE_OTHER): Payer: 59 | Admitting: Physician Assistant

## 2014-07-17 ENCOUNTER — Encounter: Payer: Self-pay | Admitting: Physician Assistant

## 2014-07-17 VITALS — BP 140/90 | HR 83 | Temp 98.0°F | Resp 19 | Wt 173.0 lb

## 2014-07-17 DIAGNOSIS — R600 Localized edema: Secondary | ICD-10-CM

## 2014-07-17 DIAGNOSIS — Z9889 Other specified postprocedural states: Secondary | ICD-10-CM

## 2014-07-17 DIAGNOSIS — F411 Generalized anxiety disorder: Secondary | ICD-10-CM | POA: Diagnosis not present

## 2014-07-17 DIAGNOSIS — E785 Hyperlipidemia, unspecified: Secondary | ICD-10-CM | POA: Diagnosis not present

## 2014-07-17 DIAGNOSIS — E1165 Type 2 diabetes mellitus with hyperglycemia: Secondary | ICD-10-CM | POA: Diagnosis not present

## 2014-07-17 DIAGNOSIS — Z9884 Bariatric surgery status: Secondary | ICD-10-CM | POA: Diagnosis not present

## 2014-07-17 DIAGNOSIS — G4700 Insomnia, unspecified: Secondary | ICD-10-CM

## 2014-07-17 DIAGNOSIS — E039 Hypothyroidism, unspecified: Secondary | ICD-10-CM

## 2014-07-17 DIAGNOSIS — I10 Essential (primary) hypertension: Secondary | ICD-10-CM | POA: Diagnosis not present

## 2014-07-17 LAB — TSH: TSH: 5.19 u[IU]/mL — ABNORMAL HIGH (ref 0.350–4.500)

## 2014-07-17 LAB — COMPLETE METABOLIC PANEL WITH GFR
ALBUMIN: 4.2 g/dL (ref 3.5–5.2)
ALT: 13 U/L (ref 0–35)
AST: 15 U/L (ref 0–37)
Alkaline Phosphatase: 63 U/L (ref 39–117)
BUN: 12 mg/dL (ref 6–23)
CALCIUM: 9.4 mg/dL (ref 8.4–10.5)
CHLORIDE: 106 meq/L (ref 96–112)
CO2: 27 meq/L (ref 19–32)
Creat: 0.66 mg/dL (ref 0.50–1.10)
GFR, Est African American: >89 mL/min
GFR, Est Non African American: >89 mL/min
GLUCOSE: 96 mg/dL (ref 70–99)
POTASSIUM: 4.8 meq/L (ref 3.5–5.3)
Sodium: 143 mEq/L (ref 135–145)
Total Bilirubin: 0.4 mg/dL (ref 0.2–1.2)
Total Protein: 6.9 g/dL (ref 6.0–8.3)

## 2014-07-17 LAB — LIPID PANEL
CHOL/HDL RATIO: 3.4 ratio
CHOLESTEROL: 205 mg/dL — AB (ref 0–200)
HDL: 61 mg/dL (ref >=46)
LDL CALC: 127 mg/dL — AB (ref 0–99)
Triglycerides: 83 mg/dL (ref <150)
VLDL: 17 mg/dL (ref 0–40)

## 2014-07-17 MED ORDER — LISINOPRIL 10 MG PO TABS: 10.0000 mg | ORAL_TABLET | Freq: Every day | ORAL | Status: DC

## 2014-07-17 NOTE — Progress Notes (Signed)
Patient ID: Cathy Howell MRN: [id number], DOB: 1956/01/01, 59 y.o. Date of Encounter: @DATE @  Chief Complaint:  Chief Complaint  Patient presents with  . Follow up    HPI: 59 y.o. year old female  Presents for routine followup office visit.  HER LAST ROV WITH ME WAS 05/01/2013. THE FOLLOWING IS COPIED FROM THAT NOTE:  She came here from [place] in 1990. She works in the [place] with [employer].  She has had lap band surgery in the past. She had a history of hyperglycemia prior to the LAP-BAND but this has been normal since lap band surgery.  In the past she had been prescribed HCTZ to use as needed for swelling. As well she would use it as needed for elevated blood pressure. She says that after the LAP-BAND when she was losing weight she was not needing the HCTZ. However recently she has been having some days where she has some swelling. As well she checks her blood pressure at work daily. Some days her blood pressure is running a little bit high and she would like to have the HCTZ available to take at those times as well.  She is on Celexa to help with anxiety and depression symptoms. She says that this works very well. Says her job is quite stressful. For example yesterday they had multiple traumas at once. Says that she takes her Celexa and Xanax every night. With this she is able to relax and  able to sleep. Never uses Xanax during the day.  At that office visit, the only medication change was for her hyperlipidemia. At that visit we discussed her lipid panel. She stated that she was eating a very low fat diet and felt that she could not improve her diet at all. Discussed cardiac risk factors. She has no family history of CAD. Discussed her LDL number and she wants to go ahead and start low-dose statin therapy. Start simvastatin 10 mg. Will return fasting for FLP and LFTs in 6 weeks.  Subsequently, she did have follow-up office visit 06/05/13.  she was taking her simvastatin 10 mg.  Follow-up lab work was obtained.   TODAY---07/17/2014:   Today patient states that she has had quite a few medical updates occur since that last visit with me.  She says that she had to come here a couple of times on days that I was not working  so she saw Dr. Dennard Schaumann. Today I reviewed those notes.  At office visit 01/24/14 with Dr. Dennard Schaumann she reported that she felt that the Celexa was not effective at controlling her stress and insomnia symptoms.  He had her wean off of the Celexa and start Effexor XR 75 mg daily.  02/28/14--Follow-up visit with Dr. Dennard Schaumann -- she was somewhat better that did not feel her symptoms were well controlled. At that visit, increased Effexor to 150 mg daily.  05/22/14--She had another visit with Dr. Dennard Schaumann on 05/22/14 for bronchitis. At that visit she was also found to have elevated blood pressure and heart rate. At that time he wasn't sure whether these findings may be secondary to her bronchitis or her shoulder pain so he told her to return for recheck in one week.  05/28/14--She called in blood pressure and pulse readings: Reported-- 157/104----100 135/104----118 145/117----96 He started metoprolol 25 mg twice a day.  06/05/14--- office visit with Dr. Darrow Bussing that the Effexor was not helping and that she really didn't feel like she needed the medication at this time so  they plan for her to wean off of that. Plan for her to continue metoprolol 25 twice a day.  07/17/14--Today patient states that she is still out of work secondary to her shoulder. Since she has been out of work she is having no stress so she feels like she is stable to stay off of the medication now. Says that her sleep is currently being interrupted because of pain with her shoulder so it is hard for her to assess her insomnia right now.  She says that she is continue to check her blood pressure at home. Systolic readings are 366 to 140s. Diastolic readings are often in the 90s sometimes  even up to 114. She is concerned because that her blood pressure is not really well controlled. Says that she was on lisinopril in the past and tolerated that well and it did bring her blood pressure down well. She states that she only takes the HCTZ as needed for LE swelling.. Says that right now being out of work and off of her feet she is not needing the HCTZ at all.  Today she also reports that in addition to all of those visits with Dr. Dennard Schaumann, she has also had some surgeries recently. 06/03/14 she had left shoulder surgery. 07/03/14 she had blepharoplasty--is that her eyelids were closing so much that it was blocking off her vision.     Past Medical History  Diagnosis Date  . Arthritis   . Hypertension   . Hyperlipidemia   . Depression   . Generalized anxiety disorder   . GERD (gastroesophageal reflux disease)   . Left rotator cuff tear   . Acute bronchitis     dx 05-22-2014---  per pt resolved no productive cough as of 05-30-2014  . History of diabetes mellitus     BORDERLINE PRIOR TO GASTRIC BAND--  NO LONGER ISSUE  . Wears glasses   . Hypothyroidism, postsurgical   . Diabetes mellitus without complication     borderline for years; resolvedd with diet and weight loss     Home Meds:  Outpatient Prescriptions Prior to Visit  Medication Sig Dispense Refill  . docusate sodium (COLACE) 100 MG capsule Take 100 mg by mouth 2 (two) times daily as needed for mild constipation.     . hydrochlorothiazide (HYDRODIURIL) 25 MG tablet Take 1 tablet (25 mg total) by mouth daily. 90 tablet 3  . levothyroxine (SYNTHROID, LEVOTHROID) 88 MCG tablet TAKE 1 TABLET BY MOUTH DAILY. 30 tablet 0  . methocarbamol (ROBAXIN) 500 MG tablet Take 1 tablet (500 mg total) by mouth every 6 (six) hours as needed. 50 tablet 2  . metoprolol tartrate (LOPRESSOR) 25 MG tablet Take 1 tablet (25 mg total) by mouth 2 (two) times daily. 60 tablet 3  . Multiple Minerals-Vitamins (CALCIUM CITRATE PLUS/MAGNESIUM PO)  Take 2 each by mouth 2 (two) times daily. 600mg /1500iu/120mg     . pantoprazole (PROTONIX) 40 MG tablet Take 1 tablet (40 mg total) by mouth daily. (Patient taking differently: Take 40 mg by mouth as needed (acid reflux). ) 30 tablet 11  . simvastatin (ZOCOR) 10 MG tablet TAKE 1 TABLET BY MOUTH AT BEDTIME. 90 tablet 1  . oxyCODONE-acetaminophen (ROXICET) 5-325 MG per tablet Take 1-2 tablets by mouth every 4 (four) hours as needed for severe pain. (Patient not taking: Reported on 06/23/2014) 60 tablet 0   No facility-administered medications prior to visit.     Allergies:  Allergies  Allergen Reactions  . Codeine Itching  History   Social History  . Marital Status: Married    Spouse Name: N/A  . Number of Children: N/A  . Years of Education: N/A   Occupational History  . Not on file.   Social History Main Topics  . Smoking status: Former Smoker -- 1.00 packs/day for 15 years    Types: Cigarettes    Quit date: 02/22/1992  . Smokeless tobacco: Never Used  . Alcohol Use: No  . Drug Use: No  . Sexual Activity: Not on file   Other Topics Concern  . Not on file   Social History Narrative    Family History  Problem Relation Age of Onset  . Cancer Maternal Grandfather     colon     Review of Systems:  See HPI for pertinent ROS. All other ROS negative.    Physical Exam: Blood pressure 140/90, pulse 83, temperature 98 F (36.7 C), temperature source Oral, resp. rate 19, weight 173 lb (78.472 kg)., Body mass index is 32.7 kg/(m^2). General: WNWD WF. Appears in no acute distress. Neck: Supple. No thyromegaly. No lymphadenopathy. No carotid bruit. Lungs: Clear bilaterally to auscultation without wheezes, rales, or rhonchi. Breathing is unlabored. Heart: RRR with S1 S2. No murmurs, rubs, or gallops. Abdomen: Soft, non-tender, non-distended with normoactive bowel sounds. No hepatomegaly. No rebound/guarding. No obvious abdominal masses. Musculoskeletal:  Strength and tone  normal for age. Extremities/Skin: Warm and dry. No clubbing or cyanosis. No edema. No rashes or suspicious lesions. Neuro: Alert and oriented X 3. Moves all extremities spontaneously. Gait is normal. CNII-XII grossly in tact. Psych:  Responds to questions appropriately with a normal affect.     ASSESSMENT AND PLAN:  59 y.o. year old female with   1.H/O-- Type 2 diabetes mellitus with hyperglycemia--prior to lap band--- Since then, A1C 04/2013---5.4 - COMPLETE METABOLIC PANEL WITH GFR  2. Essential hypertension Continue metoprolol 25 mg twice a day. Add lisinopril 10 mg daily. Schedule follow-up office visit in 2 weeks to follow-up blood pressure and check BM ET on this medication She only uses HCTZ as needed for swelling. Also, discussed that even when she does take this to only take half of a pill.  - lisinopril (PRINIVIL,ZESTRIL) 10 MG tablet; Take 1 tablet (10 mg total) by mouth daily.  Dispense: 30 tablet; Refill: 1 - COMPLETE METABOLIC PANEL WITH GFR  3. Hypothyroidism, unspecified hypothyroidism type - TSH  4. Hx of laparoscopic gastric banding  5. Generalized anxiety disorder Currently stable off of medications 6. Insomnia Currently stable off of medications 7. Bilateral edema of lower extremity She uses HCTZ as needed for lower stomach edema. She is to use only a half of a pill when needed.  8. Hyperlipidemia She is on simvastatin 10 mg. This was started 05/01/13. - COMPLETE METABOLIC PANEL WITH GFR - Lipid panel  9. S/P rotator cuff repair She is still out of work secondary to this surgery.  Follow up office visit 2 weeks or sooner if needed.     [address] [place], [place], [facility] 07/17/2014 3:36 PM

## 2014-07-31 ENCOUNTER — Ambulatory Visit: Payer: 59 | Admitting: Physician Assistant

## 2014-07-31 ENCOUNTER — Encounter: Payer: Self-pay | Admitting: Physician Assistant

## 2014-07-31 ENCOUNTER — Ambulatory Visit (INDEPENDENT_AMBULATORY_CARE_PROVIDER_SITE_OTHER): Payer: 59 | Admitting: Physician Assistant

## 2014-07-31 VITALS — BP 120/72 | HR 65 | Temp 97.7°F | Resp 19 | Wt 167.0 lb

## 2014-07-31 DIAGNOSIS — E039 Hypothyroidism, unspecified: Secondary | ICD-10-CM | POA: Diagnosis not present

## 2014-07-31 DIAGNOSIS — R600 Localized edema: Secondary | ICD-10-CM

## 2014-07-31 DIAGNOSIS — G4700 Insomnia, unspecified: Secondary | ICD-10-CM | POA: Diagnosis not present

## 2014-07-31 DIAGNOSIS — F32A Depression, unspecified: Secondary | ICD-10-CM

## 2014-07-31 DIAGNOSIS — I10 Essential (primary) hypertension: Secondary | ICD-10-CM | POA: Diagnosis not present

## 2014-07-31 DIAGNOSIS — F411 Generalized anxiety disorder: Secondary | ICD-10-CM

## 2014-07-31 DIAGNOSIS — E785 Hyperlipidemia, unspecified: Secondary | ICD-10-CM

## 2014-07-31 DIAGNOSIS — Z9889 Other specified postprocedural states: Secondary | ICD-10-CM

## 2014-07-31 DIAGNOSIS — F329 Major depressive disorder, single episode, unspecified: Secondary | ICD-10-CM | POA: Diagnosis not present

## 2014-07-31 DIAGNOSIS — Z9884 Bariatric surgery status: Secondary | ICD-10-CM | POA: Diagnosis not present

## 2014-07-31 LAB — BASIC METABOLIC PANEL WITH GFR
BUN: 15 mg/dL (ref 6–23)
CO2: 27 meq/L (ref 19–32)
Calcium: 8.8 mg/dL (ref 8.4–10.5)
Chloride: 106 mEq/L (ref 96–112)
Creat: 0.73 mg/dL (ref 0.50–1.10)
GFR, Est African American: >89 mL/min
GFR, Est Non African American: >89 mL/min
GLUCOSE: 104 mg/dL — AB (ref 70–99)
Potassium: 4.6 mEq/L (ref 3.5–5.3)
SODIUM: 141 meq/L (ref 135–145)

## 2014-07-31 MED ORDER — LISINOPRIL 10 MG PO TABS: 10.0000 mg | ORAL_TABLET | Freq: Every day | ORAL | Status: DC

## 2014-07-31 NOTE — Progress Notes (Signed)
Patient ID: Cathy Howell MRN: [id number], DOB: 1955/08/05, 59 y.o. Date of Encounter: @DATE @  Chief Complaint:  Chief Complaint  Patient presents with  . Follow up    HPI: 59 y.o. year old female  Presents for routine followup office visit.  THE FOLLOWING IS COPIED FROM NOTE 05/01/2013:  She came here from [place] in 1990. She works in the [place] with [employer].  She has had lap band surgery in the past. She had a history of hyperglycemia prior to the LAP-BAND but this has been normal since lap band surgery.  In the past she had been prescribed HCTZ to use as needed for swelling. As well she would use it as needed for elevated blood pressure. She says that after the LAP-BAND when she was losing weight she was not needing the HCTZ. However recently she has been having some days where she has some swelling. As well she checks her blood pressure at work daily. Some days her blood pressure is running a little bit high and she would like to have the HCTZ available to take at those times as well.  She is on Celexa to help with anxiety and depression symptoms. She says that this works very well. Says her job is quite stressful. For example yesterday they had multiple traumas at once. Says that she takes her Celexa and Xanax every night. With this she is able to relax and  able to sleep. Never uses Xanax during the day.  At that office visit, the only medication change was for her hyperlipidemia. At that visit we discussed her lipid panel. She stated that she was eating a very low fat diet and felt that she could not improve her diet at all. Discussed cardiac risk factors. She has no family history of CAD. Discussed her LDL number and she wants to go ahead and start low-dose statin therapy. Start simvastatin 10 mg. Will return fasting for FLP and LFTs in 6 weeks.  Subsequently, she did have follow-up office visit 06/05/13.  she was taking her simvastatin 10 mg. Follow-up lab work was  obtained.   AT OV-07/17/2014:   Today patient states that she has had quite a few medical updates occur since that last visit with me.  She says that she had to come here a couple of times on days that I was not working  so she saw Dr. Dennard Schaumann. Today I reviewed those notes.  At office visit 01/24/14 with Dr. Dennard Schaumann she reported that she felt that the Celexa was not effective at controlling her stress and insomnia symptoms.  He had her wean off of the Celexa and start Effexor XR 75 mg daily.  02/28/14--Follow-up visit with Dr. Dennard Schaumann -- she was somewhat better that did not feel her symptoms were well controlled. At that visit, increased Effexor to 150 mg daily.  05/22/14--She had another visit with Dr. Dennard Schaumann on 05/22/14 for bronchitis. At that visit she was also found to have elevated blood pressure and heart rate. At that time he wasn't sure whether these findings may be secondary to her bronchitis or her shoulder pain so he told her to return for recheck in one week.  05/28/14--She called in blood pressure and pulse readings: Reported-- 157/104----100 135/104----118 145/117----96 He started metoprolol 25 mg twice a day.  06/05/14--- office visit with Dr. Darrow Bussing that the Effexor was not helping and that she really didn't feel like she needed the medication at this time so they plan for her to wean  off of that. Plan for her to continue metoprolol 25 twice a day.  07/17/14--Today patient states that she is still out of work secondary to her shoulder. Since she has been out of work she is having no stress so she feels like she is stable to stay off of the medication now. Says that her sleep is currently being interrupted because of pain with her shoulder so it is hard for her to assess her insomnia right now.  She says that she is continue to check her blood pressure at home. Systolic readings are 814 to 140s. Diastolic readings are often in the 90s sometimes even up to 114. She is  concerned because that her blood pressure is not really well controlled. Says that she was on lisinopril in the past and tolerated that well and it did bring her blood pressure down well. She states that she only takes the HCTZ as needed for LE swelling.. Says that right now being out of work and off of her feet she is not needing the HCTZ at all.  Today she also reports that in addition to all of those visits with Dr. Dennard Schaumann, she has also had some surgeries recently. 06/03/14 she had left shoulder surgery. 07/03/14 she had blepharoplasty--is that her eyelids were closing so much that it was blocking off her vision.   At the visit 07/17/14: Obtained labs--CMET, FLP, TSH. Added lisinopril 10 mg daily.  AT OV 07/31/2014: She states that she is taking the lisinopril 10 mg daily. No adverse effects. She has been checking blood pressure at home and getting good readings. Says in the past she was most concerned about her diastolic but is getting this in the low 80s now. No complaints or concerns today.  Says that she sees Dr. Theda Sers next Thursday then they will determine when she can return to work. Says if the stress and insomnia returns when she returns to work --then she will follow-up with [place]. Otherwise, for now, she wants to stay off of those medications at this time.      Past Medical History  Diagnosis Date  . Arthritis   . Hypertension   . Hyperlipidemia   . Depression   . Generalized anxiety disorder   . GERD (gastroesophageal reflux disease)   . Left rotator cuff tear   . Acute bronchitis     dx 05-22-2014---  per pt resolved no productive cough as of 05-30-2014  . History of diabetes mellitus     BORDERLINE PRIOR TO GASTRIC BAND--  NO LONGER ISSUE  . Wears glasses   . Hypothyroidism, postsurgical   . Diabetes mellitus without complication     borderline for years; resolvedd with diet and weight loss     Home Meds:  Outpatient Prescriptions Prior to Visit  Medication  Sig Dispense Refill  . docusate sodium (COLACE) 100 MG capsule Take 100 mg by mouth 2 (two) times daily as needed for mild constipation.     . hydrochlorothiazide (HYDRODIURIL) 25 MG tablet Take 1 tablet (25 mg total) by mouth daily. 90 tablet 3  . levothyroxine (SYNTHROID, LEVOTHROID) 88 MCG tablet TAKE 1 TABLET BY MOUTH DAILY. 30 tablet 0  . metoprolol tartrate (LOPRESSOR) 25 MG tablet Take 1 tablet (25 mg total) by mouth 2 (two) times daily. 60 tablet 3  . Multiple Minerals-Vitamins (CALCIUM CITRATE PLUS/MAGNESIUM PO) Take 2 each by mouth 2 (two) times daily. 600mg /1500iu/120mg     . simvastatin (ZOCOR) 10 MG tablet TAKE 1 TABLET BY MOUTH AT  BEDTIME. 90 tablet 1  . lisinopril (PRINIVIL,ZESTRIL) 10 MG tablet Take 1 tablet (10 mg total) by mouth daily. 30 tablet 1  . methocarbamol (ROBAXIN) 500 MG tablet Take 1 tablet (500 mg total) by mouth every 6 (six) hours as needed. (Patient not taking: Reported on 07/31/2014) 50 tablet 2  . pantoprazole (PROTONIX) 40 MG tablet Take 1 tablet (40 mg total) by mouth daily. (Patient taking differently: Take 40 mg by mouth as needed (acid reflux). ) 30 tablet 11   No facility-administered medications prior to visit.     Allergies:  Allergies  Allergen Reactions  . Codeine Itching    History   Social History  . Marital Status: Married    Spouse Name: N/A  . Number of Children: N/A  . Years of Education: N/A   Occupational History  . Not on file.   Social History Main Topics  . Smoking status: Former Smoker -- 1.00 packs/day for 15 years    Types: Cigarettes    Quit date: 02/22/1992  . Smokeless tobacco: Never Used  . Alcohol Use: No  . Drug Use: No  . Sexual Activity: Not on file   Other Topics Concern  . Not on file   Social History Narrative    Family History  Problem Relation Age of Onset  . Cancer Maternal Grandfather     colon     Review of Systems:  See HPI for pertinent ROS. All other ROS negative.    Physical  Exam: Blood pressure 120/72, pulse 65, temperature 97.7 F (36.5 C), temperature source Oral, resp. rate 19, weight 167 lb (75.751 kg)., Body mass index is 31.57 kg/(m^2). General: WNWD WF. Appears in no acute distress. Neck: Supple. No thyromegaly. No lymphadenopathy. No carotid bruit. Lungs: Clear bilaterally to auscultation without wheezes, rales, or rhonchi. Breathing is unlabored. Heart: RRR with S1 S2. No murmurs, rubs, or gallops. Abdomen: Soft, non-tender, non-distended with normoactive bowel sounds. No hepatomegaly. No rebound/guarding. No obvious abdominal masses. Musculoskeletal:  Strength and tone normal for age. Extremities/Skin: Warm and dry. No clubbing or cyanosis. No edema. No rashes or suspicious lesions. Neuro: Alert and oriented X 3. Moves all extremities spontaneously. Gait is normal. CNII-XII grossly in tact. Psych:  Responds to questions appropriately with a normal affect.     ASSESSMENT AND PLAN:  59 y.o. year old female with     1. Essential hypertension Blood Pressure is now controlled and at goal with the addition of lisinopril 10 mg. Will recheck BMET with the addition of lisinopril.  Continue metoprolol 25 mg twice a day. Continue lisinopril 10 mg daily.  She only uses HCTZ as needed for swelling. Also, discussed that even when she does take this to only take half of a pill.   2. Hypothyroidism, unspecified hypothyroidism type - TSH was checked 07/17/14. Was slightly elevated but barely out of limits and the prior TSH have been normal. Therefore continued current dose with plans to just monitor.  3. Hx of laparoscopic gastric banding --- She had a history of diabetes/hyperglycemia prior to the lap band. However since that procedure, blood sugar and A1c's have been normal.  5. Generalized anxiety disorder Currently stable off of medications 6. Insomnia Currently stable off of medications 7. Bilateral edema of lower extremity She uses HCTZ as needed  for lower stomach edema. She is to use only a half of a pill when needed.  8. Hyperlipidemia She is on simvastatin 10 mg. This was started 05/01/13. FLP, CMET checked  07/17/14. Lipids were at goal and LFTs were normal. Continue simvastatin 10 mg the same.  9. S/P rotator cuff repair She is still out of work secondary to this surgery.  Follow up office visit 3 months or sooner if needed.     [address] [place], [place], [facility] 07/31/2014 12:05 PM

## 2014-08-01 ENCOUNTER — Encounter: Payer: Self-pay | Admitting: *Deleted

## 2014-08-15 ENCOUNTER — Other Ambulatory Visit: Payer: Self-pay | Admitting: Physician Assistant

## 2014-08-15 NOTE — Telephone Encounter (Signed)
Refill appropriate and filled per protocol. 

## 2014-08-19 ENCOUNTER — Other Ambulatory Visit: Payer: Self-pay | Admitting: Family Medicine

## 2014-08-19 ENCOUNTER — Telehealth: Payer: Self-pay | Admitting: Physician Assistant

## 2014-08-19 DIAGNOSIS — I10 Essential (primary) hypertension: Secondary | ICD-10-CM

## 2014-08-19 MED ORDER — METOPROLOL TARTRATE 25 MG PO TABS: 25.0000 mg | ORAL_TABLET | Freq: Two times a day (BID) | ORAL | Status: DC

## 2014-08-19 MED ORDER — LEVOTHYROXINE SODIUM 88 MCG PO TABS: 88.0000 ug | ORAL_TABLET | Freq: Every day | ORAL | Status: DC

## 2014-08-19 NOTE — Telephone Encounter (Signed)
Noted thyroid med only RF #30.  Corrected to #90 + 1.  Left pt message if this not error she was needing fixed, call back.

## 2014-08-19 NOTE — Telephone Encounter (Signed)
[phone] PT called and left VM stating that her medication was called in wrong (she didn't leave name of medication) I called pt back and left VM asking her to call us back with the name of the medication.

## 2014-10-09 ENCOUNTER — Telehealth: Payer: Self-pay | Admitting: Physician Assistant

## 2014-10-09 NOTE — Telephone Encounter (Signed)
Patient is calling to say that the metaprolol is making her break out, would like a call back about this  [phone]

## 2014-10-10 NOTE — Telephone Encounter (Signed)
Started Metoprolol in June.  Has been taking BID as directed.  Lately has been getting outbreak of sores on face around ears, mouth.  Thought it the metoprolol, so she stopped and rash cleared up.  Concerned for her BP so has been taking 2 lisinopril a day.  Please advise.   Metoprolol added to allergy list and removed from med list.

## 2014-10-12 NOTE — Telephone Encounter (Signed)
Increase Lisinopril to 20mg  1 po QD # 30 +5.  Schedule f/u OV 2-3 weeks.

## 2014-10-13 MED ORDER — LISINOPRIL 20 MG PO TABS
20.0000 mg | ORAL_TABLET | Freq: Every day | ORAL | Status: DC
Start: 2014-10-13 — End: 2015-04-15

## 2014-10-13 NOTE — Telephone Encounter (Signed)
Pt called and left message.  Take Lisinopril 20 mg daily and make OV for 2-3 weeks.

## 2014-10-20 ENCOUNTER — Other Ambulatory Visit (HOSPITAL_COMMUNITY): Payer: Self-pay | Admitting: Obstetrics and Gynecology

## 2014-10-20 DIAGNOSIS — Z1231 Encounter for screening mammogram for malignant neoplasm of breast: Secondary | ICD-10-CM

## 2014-10-21 ENCOUNTER — Ambulatory Visit (HOSPITAL_COMMUNITY)
Admission: RE | Admit: 2014-10-21 | Discharge: 2014-10-21 | Disposition: A | Discharge status: Home / Self Care | Payer: 59 | Source: Ambulatory Visit | Attending: Obstetrics and Gynecology | Admitting: Obstetrics and Gynecology

## 2014-10-21 DIAGNOSIS — Z1231 Encounter for screening mammogram for malignant neoplasm of breast: Secondary | ICD-10-CM | POA: Diagnosis not present

## 2014-11-05 ENCOUNTER — Ambulatory Visit: Payer: 59 | Admitting: Physician Assistant

## 2014-11-05 ENCOUNTER — Ambulatory Visit (INDEPENDENT_AMBULATORY_CARE_PROVIDER_SITE_OTHER): Payer: 59 | Admitting: Physician Assistant

## 2014-11-05 ENCOUNTER — Encounter: Payer: Self-pay | Admitting: Physician Assistant

## 2014-11-05 VITALS — BP 132/90 | HR 78 | Temp 97.9°F | Resp 18 | Wt 185.0 lb

## 2014-11-05 DIAGNOSIS — I10 Essential (primary) hypertension: Secondary | ICD-10-CM | POA: Diagnosis not present

## 2014-11-05 DIAGNOSIS — R600 Localized edema: Secondary | ICD-10-CM

## 2014-11-05 DIAGNOSIS — E039 Hypothyroidism, unspecified: Secondary | ICD-10-CM | POA: Diagnosis not present

## 2014-11-05 DIAGNOSIS — Z9889 Other specified postprocedural states: Secondary | ICD-10-CM

## 2014-11-05 DIAGNOSIS — E785 Hyperlipidemia, unspecified: Secondary | ICD-10-CM

## 2014-11-05 DIAGNOSIS — F329 Major depressive disorder, single episode, unspecified: Secondary | ICD-10-CM | POA: Diagnosis not present

## 2014-11-05 DIAGNOSIS — F411 Generalized anxiety disorder: Secondary | ICD-10-CM

## 2014-11-05 DIAGNOSIS — Z9884 Bariatric surgery status: Secondary | ICD-10-CM | POA: Diagnosis not present

## 2014-11-05 DIAGNOSIS — G4700 Insomnia, unspecified: Secondary | ICD-10-CM

## 2014-11-05 DIAGNOSIS — F32A Depression, unspecified: Secondary | ICD-10-CM

## 2014-11-05 NOTE — Progress Notes (Signed)
Patient ID: Cathy Howell MRN: [id number], DOB: 02-29-1955, 59 y.o. Date of Encounter: @DATE @  Chief Complaint:  Chief Complaint  Patient presents with  . Follow-up    3 mos    HPI: 59 y.o. year old female  Presents for routine followup office visit.  THE FOLLOWING IS COPIED FROM NOTE 05/01/2013:  She came here from [place] in 1990. She works in the [employer] with [employer].  She has had lap band surgery in the past. She had a history of hyperglycemia prior to the LAP-BAND but this has been normal since lap band surgery.  In the past she had been prescribed HCTZ to use as needed for swelling. As well she would use it as needed for elevated blood pressure. She says that after the LAP-BAND when she was losing weight she was not needing the HCTZ. However recently she has been having some days where she has some swelling. As well she checks her blood pressure at work daily. Some days her blood pressure is running a little bit high and she would like to have the HCTZ available to take at those times as well.  She is on Celexa to help with anxiety and depression symptoms. She says that this works very well. Says her job is quite stressful. For example yesterday they had multiple traumas at once. Says that she takes her Celexa and Xanax every night. With this she is able to relax and  able to sleep. Never uses Xanax during the day.  At that office visit, the only medication change was for her hyperlipidemia. At that visit we discussed her lipid panel. She stated that she was eating a very low fat diet and felt that she could not improve her diet at all. Discussed cardiac risk factors. She has no family history of CAD. Discussed her LDL number and she wants to go ahead and start low-dose statin therapy. Start simvastatin 10 mg. Will return fasting for FLP and LFTs in 6 weeks.  Subsequently, she did have follow-up office visit 06/05/13.  she was taking her simvastatin 10 mg. Follow-up lab work was  obtained.   AT OV-07/17/2014:   Today patient states that she has had quite a few medical updates occur since that last visit with me.  She says that she had to come here a couple of times on days that I was not working  so she saw Dr. Dennard Schaumann. Today I reviewed those notes.  At office visit 01/24/14 with Dr. Dennard Schaumann she reported that she felt that the Celexa was not effective at controlling her stress and insomnia symptoms.  He had her wean off of the Celexa and start Effexor XR 75 mg daily.  02/28/14--Follow-up visit with Dr. Dennard Schaumann -- she was somewhat better that did not feel her symptoms were well controlled. At that visit, increased Effexor to 150 mg daily.  05/22/14--She had another visit with Dr. Dennard Schaumann on 05/22/14 for bronchitis. At that visit she was also found to have elevated blood pressure and heart rate. At that time he wasn't sure whether these findings may be secondary to her bronchitis or her shoulder pain so he told her to return for recheck in one week.  05/28/14--She called in blood pressure and pulse readings: Reported-- 157/104----100 135/104----118 145/117----96 He started metoprolol 25 mg twice a day.  06/05/14--- office visit with Dr. Darrow Bussing that the Effexor was not helping and that she really didn't feel like she needed the medication at this time so they plan  for her to wean off of that. Plan for her to continue metoprolol 25 twice a day.  07/17/14--Today patient states that she is still out of work secondary to her shoulder. Since she has been out of work she is having no stress so she feels like she is stable to stay off of the medication now. Says that her sleep is currently being interrupted because of pain with her shoulder so it is hard for her to assess her insomnia right now.  She says that she is continue to check her blood pressure at home. Systolic readings are 546 to 140s. Diastolic readings are often in the 90s sometimes even up to 114. She is  concerned because that her blood pressure is not really well controlled. Says that she was on lisinopril in the past and tolerated that well and it did bring her blood pressure down well. She states that she only takes the HCTZ as needed for LE swelling.. Says that right now being out of work and off of her feet she is not needing the HCTZ at all.  Today she also reports that in addition to all of those visits with Dr. Dennard Schaumann, she has also had some surgeries recently. 06/03/14 she had left shoulder surgery. 07/03/14 she had blepharoplasty--is that her eyelids were closing so much that it was blocking off her vision.   At the visit 07/17/14: Obtained labs--CMET, FLP, TSH. Added lisinopril 10 mg daily.  AT OV 07/31/2014: She states that she is taking the lisinopril 10 mg daily. No adverse effects. She has been checking blood pressure at home and getting good readings. Says in the past she was most concerned about her diastolic but is getting this in the low 80s now. No complaints or concerns today.  Says that she sees Dr. Theda Sers next Thursday then they will determine when she can return to work. Says if the stress and insomnia returns when she returns to work --then she will follow-up with [place]. Otherwise, for now, she wants to stay off of those medications at this time.   OV---11/05/2014--Update: After her last office visit she called and reported that she had an itchy rash and had stopped the metoprolol and the rash had resolved once she stopped the metoprolol. She seemed quite convinced that it was the metoprolol causing this itching and rash. Therefore we told her she could just stay off of the metoprolol and increased lisinopril from 10 mg to 20 mg. Today she reports that she is taking the lisinopril 20 mg with no adverse effects. She reports that she had developed severe urticarial type rash with severe itching -- behind her ears and up in her scalp and down along her neck.--I am not so sure  that this was secondary to metoprolol--but again she was quite convinced and is going to be reluctant to try metoprolol again.-- Also today she says that she had not used any new products that she can think of that would've been causing this rash otherwise.  Says that she has been checking her blood pressure some in gets  130- 135/90.  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 Will be doing this light duty work at least until her next appointment with Dr. Theda Sers on October 5 and at that time will find out whether she is released to go back to her regular job.  Says that for now she continues  to feel fine off of medications for anxiety and depression. Says that she will see whether this becomes a problem when she gets back to her regular job in the [employer] with increased stress.  However at this time is feeling fine and wants to stay off of medicine and says that, in general--even long-term-- she would like to stay off of any medication possible. Also right now her sleep is okay and again would like to stay off of strong medications if at all possible--says that she has been walking her dog in the evenings trying to release any stress/tension and help her relax and would rather use a mild over-the-counter sleep aid if needed and avoid prescriptions if possible.  No other complaints or concerns today.     Past Medical History  Diagnosis Date  . Arthritis   . Hypertension   . Hyperlipidemia   . Depression   . Generalized anxiety disorder   . GERD (gastroesophageal reflux disease)   . Left rotator cuff tear   . Acute bronchitis     dx 05-22-2014---  per pt resolved no productive cough as of 05-30-2014  . History of diabetes mellitus     BORDERLINE PRIOR TO GASTRIC BAND--  NO LONGER ISSUE  . Wears glasses   . Hypothyroidism, postsurgical   . Diabetes mellitus without complication     borderline for years;  resolvedd with diet and weight loss     Home Meds:  Outpatient Prescriptions Prior to Visit  Medication Sig Dispense Refill  . docusate sodium (COLACE) 100 MG capsule Take 100 mg by mouth 2 (two) times daily as needed for mild constipation.     . hydrochlorothiazide (HYDRODIURIL) 25 MG tablet Take 1 tablet (25 mg total) by mouth daily. 90 tablet 3  . levothyroxine (SYNTHROID, LEVOTHROID) 88 MCG tablet Take 1 tablet (88 mcg total) by mouth daily. 90 tablet 1  . lisinopril (PRINIVIL,ZESTRIL) 20 MG tablet Take 1 tablet (20 mg total) by mouth daily. 90 tablet 1  . Multiple Minerals-Vitamins (CALCIUM CITRATE PLUS/MAGNESIUM PO) Take 2 each by mouth 2 (two) times daily. 600mg /1500iu/120mg     . simvastatin (ZOCOR) 10 MG tablet TAKE 1 TABLET BY MOUTH AT BEDTIME. 90 tablet 1   No facility-administered medications prior to visit.     Allergies:  Allergies  Allergen Reactions  . Codeine Itching  . Metoprolol Rash    Social History   Social History  . Marital Status: Married    Spouse Name: N/A  . Number of Children: N/A  . Years of Education: N/A   Occupational History  . Not on file.   Social History Main Topics  . Smoking status: Former Smoker -- 1.00 packs/day for 15 years    Types: Cigarettes    Quit date: 02/22/1992  . Smokeless tobacco: Never Used  . Alcohol Use: No  . Drug Use: No  . Sexual Activity: Not on file   Other Topics Concern  . Not on file   Social History Narrative    Family History  Problem Relation Age of Onset  . Cancer Maternal Grandfather     colon     Review of Systems:  See HPI for pertinent ROS. All other ROS negative.    Physical Exam: Blood pressure 132/90, pulse 78, temperature 97.9 F (36.6 C), temperature source Oral, resp. rate 18, weight 185 lb (83.915 kg)., Body mass index is 34.97 kg/(m^2). General: WNWD WF. Appears in no acute distress. Neck: Supple. No thyromegaly. No lymphadenopathy. No carotid  bruit. Lungs: Clear bilaterally  to auscultation without wheezes, rales, or rhonchi. Breathing is unlabored. Heart: RRR with S1 S2. No murmurs, rubs, or gallops. Abdomen: Soft, non-tender, non-distended with normoactive bowel sounds. No hepatomegaly. No rebound/guarding. No obvious abdominal masses. Musculoskeletal:  Strength and tone normal for age. Extremities/Skin: Warm and dry. No edema. No rash. Neuro: Alert and oriented X 3. Moves all extremities spontaneously. Gait is normal. CNII-XII grossly in tact. Psych:  Responds to questions appropriately with a normal affect.     ASSESSMENT AND PLAN:  59 y.o. year old female with     1. Essential hypertension Blood Pressure is now controlled and at goal on lisinopril 20 mg. Will recheck BMET with increased dose.    She only uses HCTZ as needed for swelling. Also, discussed that even when she does take this to only take half of a pill.   2. Hypothyroidism, unspecified hypothyroidism type - TSH was checked 07/17/14. Was slightly elevated but barely out of limits and the prior TSH have been normal. Therefore continued current dose with plans to just monitor. Since we need to do blood work today to monitor her lisinopril will go ahead and recheck TSH now and then plan to wait to recheck lab and office visit 6 months.  3. Hx of laparoscopic gastric banding --- She had a history of diabetes/hyperglycemia prior to the lap band. However since that procedure, blood sugar and A1c's have been normal.  5. Generalized anxiety disorder Currently stable off of medications 6. Insomnia Currently stable off of medications 7. Bilateral edema of lower extremity She uses HCTZ as needed for lower stomach edema. She is to use only a half of a pill when needed.  8. Hyperlipidemia She is on simvastatin 10 mg. This was started 05/01/13. FLP, CMET checked 07/17/14. Lipids were at goal and LFTs were normal. Continue simvastatin 10 mg the same. Since we need to repeat lab work today on  increased lisinopril dose will go ahead and recheck CMP T. She can wait 6 months for follow-up office visit and labs.  9. S/P rotator cuff repair She is on light duty secondary to this surgery.  Follow up office visit 6 months or sooner if needed.     Marin Olp [place], [place], [facility] 11/05/2014 8:32 AM

## 2014-11-06 LAB — COMPLETE METABOLIC PANEL WITH GFR
ALT: 14 U/L (ref 6–29)
AST: 14 U/L (ref 10–35)
Albumin: 4.1 g/dL (ref 3.6–5.1)
Alkaline Phosphatase: 69 U/L (ref 33–130)
BUN: 12 mg/dL (ref 7–25)
CHLORIDE: 105 mmol/L (ref 98–110)
CO2: 24 mmol/L (ref 20–31)
Calcium: 9.2 mg/dL (ref 8.6–10.4)
Creat: 0.83 mg/dL (ref 0.50–1.05)
GFR, Est Non African American: 78 mL/min (ref >=60)
Glucose, Bld: 96 mg/dL (ref 70–99)
POTASSIUM: 4.2 mmol/L (ref 3.5–5.3)
Sodium: 140 mmol/L (ref 135–146)
Total Bilirubin: 0.3 mg/dL (ref 0.2–1.2)
Total Protein: 6.6 g/dL (ref 6.1–8.1)

## 2014-11-06 LAB — TSH: TSH: 6.84 u[IU]/mL — AB (ref 0.350–4.500)

## 2014-11-07 ENCOUNTER — Other Ambulatory Visit: Payer: Self-pay | Admitting: Family Medicine

## 2014-11-07 DIAGNOSIS — E039 Hypothyroidism, unspecified: Secondary | ICD-10-CM

## 2014-11-07 DIAGNOSIS — Z79899 Other long term (current) drug therapy: Secondary | ICD-10-CM

## 2014-11-07 MED ORDER — LEVOTHYROXINE SODIUM 100 MCG PO TABS: 100.0000 ug | ORAL_TABLET | Freq: Every day | ORAL | Status: DC

## 2014-11-24 ENCOUNTER — Other Ambulatory Visit: Payer: Self-pay | Admitting: Obstetrics and Gynecology

## 2014-11-25 LAB — CYTOLOGY - PAP

## 2014-12-11 ENCOUNTER — Other Ambulatory Visit (HOSPITAL_COMMUNITY): Payer: Self-pay | Admitting: Ophthalmology

## 2014-12-11 DIAGNOSIS — M25512 Pain in left shoulder: Secondary | ICD-10-CM

## 2014-12-19 ENCOUNTER — Encounter (HOSPITAL_COMMUNITY): Payer: Self-pay | Admitting: Radiology

## 2014-12-19 ENCOUNTER — Ambulatory Visit (HOSPITAL_COMMUNITY)
Admission: RE | Admit: 2014-12-19 | Discharge: 2014-12-19 | Disposition: A | Discharge status: Home / Self Care | Payer: PRIVATE HEALTH INSURANCE | Source: Ambulatory Visit | Attending: Ophthalmology | Admitting: Ophthalmology

## 2014-12-19 DIAGNOSIS — M25512 Pain in left shoulder: Secondary | ICD-10-CM | POA: Insufficient documentation

## 2014-12-19 DIAGNOSIS — S46012D Strain of muscle(s) and tendon(s) of the rotator cuff of left shoulder, subsequent encounter: Secondary | ICD-10-CM | POA: Insufficient documentation

## 2014-12-19 DIAGNOSIS — Z9889 Other specified postprocedural states: Secondary | ICD-10-CM | POA: Diagnosis not present

## 2014-12-19 MED ORDER — IOHEXOL 300 MG/ML  SOLN: 50.0000 mL | Freq: Once | INTRAMUSCULAR | Status: DC | PRN

## 2014-12-19 MED ORDER — IOHEXOL 300 MG/ML  SOLN
50.0000 mL | Freq: Once | INTRAMUSCULAR | Status: DC | PRN
  Administered 2014-12-19: 10 mL
  Filled 2014-12-19: qty 50

## 2014-12-19 MED ORDER — GADOBENATE DIMEGLUMINE 529 MG/ML IV SOLN
5.0000 mL | Freq: Once | INTRAVENOUS | Status: AC | PRN
  Administered 2014-12-19: 5 mL via INTRAVENOUS

## 2015-02-03 ENCOUNTER — Other Ambulatory Visit: Payer: Self-pay | Admitting: Physician Assistant

## 2015-02-03 NOTE — Telephone Encounter (Signed)
Medication refilled per protocol. 

## 2015-02-12 ENCOUNTER — Other Ambulatory Visit: Payer: 59

## 2015-02-12 DIAGNOSIS — Z79899 Other long term (current) drug therapy: Secondary | ICD-10-CM

## 2015-02-12 DIAGNOSIS — E039 Hypothyroidism, unspecified: Secondary | ICD-10-CM

## 2015-02-12 LAB — TSH: TSH: 2.053 u[IU]/mL (ref 0.350–4.500)

## 2015-02-13 ENCOUNTER — Other Ambulatory Visit: Payer: Self-pay | Admitting: Family Medicine

## 2015-02-13 ENCOUNTER — Encounter: Payer: Self-pay | Admitting: Family Medicine

## 2015-02-13 MED ORDER — LEVOTHYROXINE SODIUM 100 MCG PO TABS: ORAL_TABLET | ORAL | Status: DC

## 2015-03-19 MED FILL — IBUPROFEN 800 MG TABLET: 800 | 30 days supply | Qty: 90 | Fill #0

## 2015-03-20 MED FILL — LEVOTHYROXINE 100 MCG TAB: 100 | 90 days supply | Qty: 90 | Fill #0

## 2015-04-13 MED FILL — DICLOFENAC SODIUM 1% GEL: 1 | 12 days supply | Qty: 200 | Fill #0

## 2015-04-13 MED FILL — traMADol HCL 50 MG TABS: 50 | 13 days supply | Qty: 40 | Fill #0

## 2015-04-15 ENCOUNTER — Other Ambulatory Visit: Payer: Self-pay | Admitting: Physician Assistant

## 2015-04-15 MED FILL — HYDROCHLOROTHIAZIDE 25 MG T: 25 | 90 days supply | Qty: 90 | Fill #1

## 2015-04-15 NOTE — Telephone Encounter (Signed)
Medication refilled per protocol. 

## 2015-04-27 MED FILL — LISINOPRIL 20 MG TABLET: 20 | 90 days supply | Qty: 90 | Fill #0

## 2015-05-13 DIAGNOSIS — R635 Abnormal weight gain: Secondary | ICD-10-CM | POA: Diagnosis not present

## 2015-05-13 DIAGNOSIS — R5383 Other fatigue: Secondary | ICD-10-CM | POA: Diagnosis not present

## 2015-05-13 DIAGNOSIS — Z79899 Other long term (current) drug therapy: Secondary | ICD-10-CM | POA: Diagnosis not present

## 2015-05-13 DIAGNOSIS — R0602 Shortness of breath: Secondary | ICD-10-CM | POA: Diagnosis not present

## 2015-05-26 MED FILL — CLINDAMYCIN HCL 150 MG CAP: 150 | 8 days supply | Qty: 30 | Fill #0

## 2015-06-15 DIAGNOSIS — R635 Abnormal weight gain: Secondary | ICD-10-CM | POA: Diagnosis not present

## 2015-06-15 MED FILL — PHENTERMINE 37.5 MG TABLET: 37.5 | 30 days supply | Qty: 30 | Fill #0

## 2015-06-19 DIAGNOSIS — H04123 Dry eye syndrome of bilateral lacrimal glands: Secondary | ICD-10-CM | POA: Diagnosis not present

## 2015-06-19 DIAGNOSIS — H43813 Vitreous degeneration, bilateral: Secondary | ICD-10-CM | POA: Diagnosis not present

## 2015-06-19 DIAGNOSIS — H40003 Preglaucoma, unspecified, bilateral: Secondary | ICD-10-CM | POA: Diagnosis not present

## 2015-06-22 MED FILL — CLINDAMYCIN HCL 150 MG CAP: 150 | 8 days supply | Qty: 30 | Fill #1

## 2015-06-23 ENCOUNTER — Encounter: Payer: Self-pay | Admitting: Family Medicine

## 2015-06-23 ENCOUNTER — Telehealth: Payer: Self-pay | Admitting: Physician Assistant

## 2015-06-23 MED ORDER — LEVOTHYROXINE SODIUM 100 MCG PO TABS: ORAL_TABLET | ORAL | Status: DC

## 2015-06-23 MED FILL — LEVOTHYROXINE 100 MCG TAB: 100 | 90 days supply | Qty: 90 | Fill #0

## 2015-06-23 NOTE — Telephone Encounter (Signed)
Medication refill for one time only.  Patient needs to be seen.  Letter sent for patient to call and schedule 

## 2015-06-23 NOTE — Telephone Encounter (Signed)
Pt needs a 90 day refill of Levothyroxine sent to the [facility]

## 2015-06-23 NOTE — Telephone Encounter (Signed)
Can send a 90 + 0 but tell her to come for OV and fasting labs within a month. (on other meds that also need monitoring--they will be needing refills--

## 2015-07-02 ENCOUNTER — Encounter: Payer: Self-pay | Admitting: Physician Assistant

## 2015-07-02 ENCOUNTER — Ambulatory Visit (INDEPENDENT_AMBULATORY_CARE_PROVIDER_SITE_OTHER): Payer: 59 | Admitting: Physician Assistant

## 2015-07-02 VITALS — BP 118/74 | HR 82 | Temp 98.1°F | Resp 16 | Ht 60.0 in | Wt 168.0 lb

## 2015-07-02 DIAGNOSIS — E039 Hypothyroidism, unspecified: Secondary | ICD-10-CM | POA: Diagnosis not present

## 2015-07-02 DIAGNOSIS — E785 Hyperlipidemia, unspecified: Secondary | ICD-10-CM | POA: Diagnosis not present

## 2015-07-02 DIAGNOSIS — I10 Essential (primary) hypertension: Secondary | ICD-10-CM | POA: Diagnosis not present

## 2015-07-02 DIAGNOSIS — G4700 Insomnia, unspecified: Secondary | ICD-10-CM

## 2015-07-02 LAB — COMPLETE METABOLIC PANEL WITH GFR
ALBUMIN: 4 g/dL (ref 3.6–5.1)
ALK PHOS: 64 U/L (ref 33–130)
ALT: 14 U/L (ref 6–29)
AST: 15 U/L (ref 10–35)
BUN: 18 mg/dL (ref 7–25)
CALCIUM: 9 mg/dL (ref 8.6–10.4)
CHLORIDE: 104 mmol/L (ref 98–110)
CO2: 23 mmol/L (ref 20–31)
Creat: 0.7 mg/dL (ref 0.50–1.05)
GFR, Est African American: >89 mL/min (ref >=60)
GFR, Est Non African American: >89 mL/min (ref >=60)
GLUCOSE: 84 mg/dL (ref 70–99)
POTASSIUM: 4.1 mmol/L (ref 3.5–5.3)
Sodium: 139 mmol/L (ref 135–146)
Total Bilirubin: 0.3 mg/dL (ref 0.2–1.2)
Total Protein: 6.6 g/dL (ref 6.1–8.1)

## 2015-07-02 LAB — LIPID PANEL
CHOL/HDL RATIO: 4.1 ratio (ref <=5.0)
CHOLESTEROL: 203 mg/dL — AB (ref 125–200)
HDL: 49 mg/dL (ref >=46)
LDL Cholesterol: 136 mg/dL — ABNORMAL HIGH (ref <130)
Triglycerides: 92 mg/dL (ref <150)
VLDL: 18 mg/dL (ref <30)

## 2015-07-02 LAB — TSH: TSH: 4.46 m[IU]/L

## 2015-07-02 MED ORDER — CLONAZEPAM 0.5 MG PO TABS: 0.5000 mg | ORAL_TABLET | Freq: Every day | ORAL | Status: DC

## 2015-07-02 NOTE — Progress Notes (Signed)
Patient ID: Cathy Howell MRN: [id number], DOB: [birth year], 60 y.o. Date of Encounter: @DATE @  Chief Complaint:  Chief Complaint  Patient presents with  . F/U    is fasting    HPI: 60 y.o. year old female  Presents for routine followup office visit.  THE FOLLOWING IS COPIED FROM NOTE 05/01/2013:  She came here from [place] in 1990. She works in the [place] with [employer].  She has had lap band surgery in the past. She had a history of hyperglycemia prior to the LAP-BAND but this has been normal since lap band surgery.  In the past she had been prescribed HCTZ to use as needed for swelling. As well she would use it as needed for elevated blood pressure. She says that after the LAP-BAND when she was losing weight she was not needing the HCTZ. However recently she has been having some days where she has some swelling. As well she checks her blood pressure at work daily. Some days her blood pressure is running a little bit high and she would like to have the HCTZ available to take at those times as well.  She is on Celexa to help with anxiety and depression symptoms. She says that this works very well. Says her job is quite stressful. For example yesterday they had multiple traumas at once. Says that she takes her Celexa and Xanax every night. With this she is able to relax and  able to sleep. Never uses Xanax during the day.  At that office visit, the only medication change was for her hyperlipidemia. At that visit we discussed her lipid panel. She stated that she was eating a very low fat diet and felt that she could not improve her diet at all. Discussed cardiac risk factors. She has no family history of CAD. Discussed her LDL number and she wants to go ahead and start low-dose statin therapy. Start simvastatin 10 mg. Will return fasting for FLP and LFTs in 6 weeks.  Subsequently, she did have follow-up office visit 06/05/13.  she was taking her simvastatin 10 mg. Follow-up lab work was  obtained.   AT OV-07/17/2014:   Today patient states that she has had quite a few medical updates occur since that last visit with me.  She says that she had to come here a couple of times on days that I was not working  so she saw Dr. Dennard Schaumann. Today I reviewed those notes.  At office visit 01/24/14 with Dr. Dennard Schaumann she reported that she felt that the Celexa was not effective at controlling her stress and insomnia symptoms.  He had her wean off of the Celexa and start Effexor XR 75 mg daily.  02/28/14--Follow-up visit with Dr. Dennard Schaumann -- she was somewhat better that did not feel her symptoms were well controlled. At that visit, increased Effexor to 150 mg daily.  05/22/14--She had another visit with Dr. Dennard Schaumann on 05/22/14 for bronchitis. At that visit she was also found to have elevated blood pressure and heart rate. At that time he wasn't sure whether these findings may be secondary to her bronchitis or her shoulder pain so he told her to return for recheck in one week.  05/28/14--She called in blood pressure and pulse readings: Reported-- 157/104----100 135/104----118 145/117----96 He started metoprolol 25 mg twice a day.  06/05/14--- office visit with Dr. Darrow Bussing that the Effexor was not helping and that she really didn't feel like she needed the medication at this time so they plan  for her to wean off of that. Plan for her to continue metoprolol 25 twice a day.  07/17/14--Today patient states that she is still out of work secondary to her shoulder. Since she has been out of work she is having no stress so she feels like she is stable to stay off of the medication now. Says that her sleep is currently being interrupted because of pain with her shoulder so it is hard for her to assess her insomnia right now.  She says that she is continue to check her blood pressure at home. Systolic readings are [id number] to 140s. Diastolic readings are often in the 90s sometimes even up to 114. She is  concerned because that her blood pressure is not really well controlled. Says that she was on lisinopril in the past and tolerated that well and it did bring her blood pressure down well. She states that she only takes the HCTZ as needed for LE swelling.. Says that right now being out of work and off of her feet she is not needing the HCTZ at all.  Today she also reports that in addition to all of those visits with Dr. Dennard Schaumann, she has also had some surgeries recently. 06/03/14 she had left shoulder surgery. 07/03/14 she had blepharoplasty--is that her eyelids were closing so much that it was blocking off her vision.   At the visit 07/17/14: Obtained labs--CMET, FLP, TSH. Added lisinopril 10 mg daily.  AT OV 07/31/2014: She states that she is taking the lisinopril 10 mg daily. No adverse effects. She has been checking blood pressure at home and getting good readings. Says in the past she was most concerned about her diastolic but is getting this in the low 80s now. No complaints or concerns today.  Says that she sees Dr. Theda Sers next Thursday then they will determine when she can return to work. Says if the stress and insomnia returns when she returns to work --then she will follow-up with [place]. Otherwise, for now, she wants to stay off of those medications at this time.   OV---11/05/2014--Update: After her last office visit she called and reported that she had an itchy rash and had stopped the metoprolol and the rash had resolved once she stopped the metoprolol. She seemed quite convinced that it was the metoprolol causing this itching and rash. Therefore we told her she could just stay off of the metoprolol and increased lisinopril from 10 mg to 20 mg. Today she reports that she is taking the lisinopril 20 mg with no adverse effects. She reports that she had developed severe urticarial type rash with severe itching -- behind her ears and up in her scalp and down along her neck.--I am not so sure  that this was secondary to metoprolol--but again she was quite convinced and is going to be reluctant to try metoprolol again.-- Also today she says that she had not used any new products that she can think of that would've been causing this rash otherwise.  Says that she has been checking her blood pressure some in gets  130- 135/90.  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 Will be doing this light duty work at least until her next appointment with Dr. Theda Sers on October 5 and at that time will find out whether she is released to go back to her regular job.  Says that for now she continues  to feel fine off of medications for anxiety and depression. Says that she will see whether this becomes a problem when she gets back to her regular job in the [place] with increased stress.  However at this time is feeling fine and wants to stay off of medicine and says that, in general--even long-term-- she would like to stay off of any medication possible. Also right now her sleep is okay and again would like to stay off of strong medications if at all possible--says that she has been walking her dog in the evenings trying to release any stress/tension and help her relax and would rather use a mild over-the-counter sleep aid if needed and avoid prescriptions if possible.  OV---07/02/2015: Regarding meds for anxiety--she is still off meds for anxiety. Says she "is just coping with it"--regarding stress at work. Does not feel that she needs med for this.  She is having difficulty sleeping. When she went back to work after her shoulder surgery, they moved her to 2nd shift. Says that means if there's a trauma or something, getting finished at midnight or 1 am. Says after that, can't just go home and go straight to sleep after that kind of work.  But then, in morning, if sleeps until 10 am, she feels like she has missed/wasted half the  day. Also says when she is sleeping, does not feel like it's a deep sleep and not sleeping 8 hours.  Is taking BP meds as directed. No lightheadness or other adv effects.  Taking simvastatin. No myalgias or other adv effects.  Taking thyroid med as directed.      Past Medical History  Diagnosis Date  . Arthritis   . Hypertension   . Hyperlipidemia   . Depression   . Generalized anxiety disorder   . GERD (gastroesophageal reflux disease)   . Left rotator cuff tear   . Acute bronchitis     dx 05-22-2014---  per pt resolved no productive cough as of 05-30-2014  . History of diabetes mellitus     BORDERLINE PRIOR TO GASTRIC BAND--  NO LONGER ISSUE  . Wears glasses   . Hypothyroidism, postsurgical   . Diabetes mellitus without complication ([place])     borderline for years; resolvedd with diet and weight loss     Home Meds:  Outpatient Prescriptions Prior to Visit  Medication Sig Dispense Refill  . docusate sodium (COLACE) 100 MG capsule Take 100 mg by mouth 2 (two) times daily as needed for mild constipation.     . hydrochlorothiazide (HYDRODIURIL) 25 MG tablet TAKE 1 TABLET BY MOUTH DAILY. 90 tablet 1  . levothyroxine (SYNTHROID, LEVOTHROID) 100 MCG tablet TAKE 1 TABLET (100 MCG TOTAL) BY MOUTH DAILY 90 tablet 0  . lisinopril (PRINIVIL,ZESTRIL) 20 MG tablet TAKE 1 TABLET BY MOUTH DAILY. 90 tablet 1  . Multiple Minerals-Vitamins (CALCIUM CITRATE PLUS/MAGNESIUM PO) Take 2 each by mouth 2 (two) times daily. 600mg /1500iu/120mg     . simvastatin (ZOCOR) 10 MG tablet TAKE 1 TABLET BY MOUTH AT BEDTIME. 90 tablet 1   No facility-administered medications prior to visit.     Allergies:  Allergies  Allergen Reactions  . Codeine Itching  . Metoprolol Rash    Social History   Social History  . Marital Status: Married    Spouse Name: N/A  . Number of Children: N/A  . Years of Education: N/A   Occupational History  . Not on file.   Social History Main Topics  . Smoking status:  Former  Smoker -- 1.00 packs/day for 15 years    Types: Cigarettes    Quit date: 02/22/1992  . Smokeless tobacco: Never Used  . Alcohol Use: No  . Drug Use: No  . Sexual Activity: Not on file   Other Topics Concern  . Not on file   Social History Narrative    Family History  Problem Relation Age of Onset  . Cancer Maternal Grandfather     colon     Review of Systems:  See HPI for pertinent ROS. All other ROS negative.    Physical Exam: Blood pressure 118/74, pulse 82, temperature 98.1 F (36.7 C), temperature source Oral, resp. rate 16, height 5' (1.524 m), weight 168 lb (76.204 kg)., Body mass index is 32.81 kg/(m^2). General: WNWD WF. Appears in no acute distress. Neck: Supple. No thyromegaly. No lymphadenopathy. No carotid bruit. Lungs: Clear bilaterally to auscultation without wheezes, rales, or rhonchi. Breathing is unlabored. Heart: RRR with S1 S2. No murmurs, rubs, or gallops. Abdomen: Soft, non-tender, non-distended with normoactive bowel sounds. No hepatomegaly. No rebound/guarding. No obvious abdominal masses. Musculoskeletal:  Strength and tone normal for age. Extremities/Skin: Warm and dry. No edema. No rash. Neuro: Alert and oriented X 3. Moves all extremities spontaneously. Gait is normal. CNII-XII grossly in tact. Psych:  Responds to questions appropriately with a normal affect.     ASSESSMENT AND PLAN:  60 y.o. year old female with     1. Essential hypertension Blood Pressure is now controlled and at goal. Will check BMET to monitor lab.  She only uses HCTZ as needed for swelling. Also, discussed that even when she does take this to only take half of a pill.  2. Hypothyroidism, unspecified hypothyroidism type - TSH   3. Hx of laparoscopic gastric banding --- She had a history of diabetes/hyperglycemia prior to the lap band. However since that procedure, blood sugar and A1c's have been normal.  5. Generalized anxiety disorder Currently stable off  of medications  6. Insomnia Given her shift change and lack of sleep, discussed meds.  Discussed Ambien.  She prefers a med to help her "wind down" after work so she can sleep and see how that works for her.  - clonazePAM (KLONOPIN) 0.5 MG tablet; Take 1 tablet (0.5 mg total) by mouth at bedtime.  Dispense: 30 tablet; Refill: 2   Hyperlipidemia On Simvastatin. Check lab to monitor. - COMPLETE METABOLIC PANEL WITH GFR - Lipid panel  Bilateral edema of lower extremity She uses HCTZ as needed for lower stomach edema. She is to use only a half of a pill when needed.  Follow up office visit 6 months or sooner if needed.     [address] [place], [place], [facility] 07/02/2015 12:19 PM

## 2015-07-08 ENCOUNTER — Encounter: Payer: Self-pay | Admitting: Family Medicine

## 2015-07-08 MED FILL — clonazePAM 0.5 MG TABS: 0.5 | 30 days supply | Qty: 30 | Fill #0

## 2015-08-03 ENCOUNTER — Ambulatory Visit (INDEPENDENT_AMBULATORY_CARE_PROVIDER_SITE_OTHER): Payer: 59 | Admitting: Physician Assistant

## 2015-08-03 ENCOUNTER — Encounter: Payer: Self-pay | Admitting: Physician Assistant

## 2015-08-03 VITALS — BP 116/84 | HR 84 | Temp 98.9°F | Resp 18 | Wt 166.0 lb

## 2015-08-03 DIAGNOSIS — Z713 Dietary counseling and surveillance: Secondary | ICD-10-CM | POA: Diagnosis not present

## 2015-08-03 DIAGNOSIS — G4700 Insomnia, unspecified: Secondary | ICD-10-CM

## 2015-08-03 MED ORDER — PHENTERMINE HCL 37.5 MG PO CAPS: 37.5000 mg | ORAL_CAPSULE | ORAL | Status: DC

## 2015-08-03 MED ORDER — CLONAZEPAM 1 MG PO TABS: 1.0000 mg | ORAL_TABLET | Freq: Every day | ORAL | Status: DC

## 2015-08-03 MED FILL — PHENTERMINE 37.5 MG TABLET: 37.5 | 30 days supply | Qty: 30 | Fill #0

## 2015-08-03 MED FILL — clonazePAM 1 MG TABS: 1 | 30 days supply | Qty: 30 | Fill #0

## 2015-08-03 MED FILL — LISINOPRIL 20 MG TABLET: 20 | 90 days supply | Qty: 90 | Fill #1

## 2015-08-03 NOTE — Progress Notes (Signed)
Patient ID: Cathy Howell MRN: [id number], DOB: 08-15-1955, 60 y.o. Date of Encounter: @DATE @  Chief Complaint:  Chief Complaint  Patient presents with  . routine follow up    discuss weight loss    HPI: 60 y.o. year old female  Presents for routine followup office visit.  THE FOLLOWING IS COPIED FROM NOTE 05/01/2013:  She came here from [place] in 1990. She works in the [place] with [employer].  She has had lap band surgery in the past. She had a history of hyperglycemia prior to the LAP-BAND but this has been normal since lap band surgery.  In the past she had been prescribed HCTZ to use as needed for swelling. As well she would use it as needed for elevated blood pressure. She says that after the LAP-BAND when she was losing weight she was not needing the HCTZ. However recently she has been having some days where she has some swelling. As well she checks her blood pressure at work daily. Some days her blood pressure is running a little bit high and she would like to have the HCTZ available to take at those times as well.  She is on Celexa to help with anxiety and depression symptoms. She says that this works very well. Says her job is quite stressful. For example yesterday they had multiple traumas at once. Says that she takes her Celexa and Xanax every night. With this she is able to relax and  able to sleep. Never uses Xanax during the day.  At that office visit, the only medication change was for her hyperlipidemia. At that visit we discussed her lipid panel. She stated that she was eating a very low fat diet and felt that she could not improve her diet at all. Discussed cardiac risk factors. She has no family history of CAD. Discussed her LDL number and she wants to go ahead and start low-dose statin therapy. Start simvastatin 10 mg. Will return fasting for FLP and LFTs in 6 weeks.  Subsequently, she did have follow-up office visit 06/05/13.  she was taking her simvastatin 10 mg.  Follow-up lab work was obtained.   AT OV-07/17/2014:   Today patient states that she has had quite a few medical updates occur since that last visit with me.  She says that she had to come here a couple of times on days that I was not working  so she saw Dr. Dennard Schaumann. Today I reviewed those notes.  At office visit 01/24/14 with Dr. Dennard Schaumann she reported that she felt that the Celexa was not effective at controlling her stress and insomnia symptoms.  He had her wean off of the Celexa and start Effexor XR 75 mg daily.  02/28/14--Follow-up visit with Dr. Dennard Schaumann -- she was somewhat better that did not feel her symptoms were well controlled. At that visit, increased Effexor to 150 mg daily.  05/22/14--She had another visit with Dr. Dennard Schaumann on 05/22/14 for bronchitis. At that visit she was also found to have elevated blood pressure and heart rate. At that time he wasn't sure whether these findings may be secondary to her bronchitis or her shoulder pain so he told her to return for recheck in one week.  05/28/14--She called in blood pressure and pulse readings: Reported-- 157/104----100 135/104----118 145/117----96 He started metoprolol 25 mg twice a day.  06/05/14--- office visit with Dr. Darrow Bussing that the Effexor was not helping and that she really didn't feel like she needed the medication at this time  so they plan for her to wean off of that. Plan for her to continue metoprolol 25 twice a day.  07/17/14--Today patient states that she is still out of work secondary to her shoulder. Since she has been out of work she is having no stress so she feels like she is stable to stay off of the medication now. Says that her sleep is currently being interrupted because of pain with her shoulder so it is hard for her to assess her insomnia right now.  She says that she is continue to check her blood pressure at home. Systolic readings are [id number] to 140s. Diastolic readings are often in the 90s sometimes  even up to 114. She is concerned because that her blood pressure is not really well controlled. Says that she was on lisinopril in the past and tolerated that well and it did bring her blood pressure down well. She states that she only takes the HCTZ as needed for LE swelling.. Says that right now being out of work and off of her feet she is not needing the HCTZ at all.  Today she also reports that in addition to all of those visits with Dr. Dennard Schaumann, she has also had some surgeries recently. 06/03/14 she had left shoulder surgery. 07/03/14 she had blepharoplasty--is that her eyelids were closing so much that it was blocking off her vision.   At the visit 07/17/14: Obtained labs--CMET, FLP, TSH. Added lisinopril 10 mg daily.  AT OV 07/31/2014: She states that she is taking the lisinopril 10 mg daily. No adverse effects. She has been checking blood pressure at home and getting good readings. Says in the past she was most concerned about her diastolic but is getting this in the low 80s now. No complaints or concerns today.  Says that she sees Dr. Theda Sers next Thursday then they will determine when she can return to work. Says if the stress and insomnia returns when she returns to work --then she will follow-up with [place]. Otherwise, for now, she wants to stay off of those medications at this time.   OV---11/05/2014--Update: After her last office visit she called and reported that she had an itchy rash and had stopped the metoprolol and the rash had resolved once she stopped the metoprolol. She seemed quite convinced that it was the metoprolol causing this itching and rash. Therefore we told her she could just stay off of the metoprolol and increased lisinopril from 10 mg to 20 mg. Today she reports that she is taking the lisinopril 20 mg with no adverse effects. She reports that she had developed severe urticarial type rash with severe itching -- behind her ears and up in her scalp and down along her  neck.--I am not so sure that this was secondary to metoprolol--but again she was quite convinced and is going to be reluctant to try metoprolol again.-- Also today she says that she had not used any new products that she can think of that would've been causing this rash otherwise.  Says that she has been checking her blood pressure some in gets  130- 135/90.  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 Will be doing this light duty work at least until her next appointment with Dr. Theda Sers on October 5 and at that time will find out whether she is released to go back to her regular job.  Says that for  now she continues to feel fine off of medications for anxiety and depression. Says that she will see whether this becomes a problem when she gets back to her regular job in the [place] with increased stress.  However at this time is feeling fine and wants to stay off of medicine and says that, in general--even long-term-- she would like to stay off of any medication possible. Also right now her sleep is okay and again would like to stay off of strong medications if at all possible--says that she has been walking her dog in the evenings trying to release any stress/tension and help her relax and would rather use a mild over-the-counter sleep aid if needed and avoid prescriptions if possible.  OV---07/02/2015: Regarding meds for anxiety--she is still off meds for anxiety. Says she "is just coping with it"--regarding stress at work. Does not feel that she needs med for this.  She is having difficulty sleeping. When she went back to work after her shoulder surgery, they moved her to 2nd shift. Says that means if there's a trauma or something, getting finished at midnight or 1 am. Says after that, can't just go home and go straight to sleep after that kind of work.  But then, in morning, if sleeps until 10 am, she feels like she has  missed/wasted half the day. Also says when she is sleeping, does not feel like it's a deep sleep and not sleeping 8 hours.  Is taking BP meds as directed. No lightheadness or other adv effects.  Taking simvastatin. No myalgias or other adv effects.  Taking thyroid med as directed.   OV 08/03/2015: She returned for OV today to discuss a few specific issues: 1--At LOV, Rxed Clonazepam 0.5mg  for her to use when she gets home from work so she can relax and go to sleep.  She says she thinks she needs higher dose. Really doesn't feel much effect when she takes this.  2- She recently went to a weight loss clinic. They gave her an extremely strict diet--she brought that paper with her today--was to eat one egg and one serving bacon or sausage, one fruit for breakfast.  She was to eat salad for lunch and dinner. She was to eat apple for snack.  She lost 15 pounds in first month. When she went for f/u he was upset that she had eaten peanut butter on apple for snack and that she had not lost 20 pounds.  She is not going back there.  However, she says they Rxed Adipex and it has helped a lot and she wants to know if I could Rx her some Adipex to use for another couple months.      Past Medical History  Diagnosis Date  . Arthritis   . Hypertension   . Hyperlipidemia   . Depression   . Generalized anxiety disorder   . GERD (gastroesophageal reflux disease)   . Left rotator cuff tear   . Acute bronchitis     dx 05-22-2014---  per pt resolved no productive cough as of 05-30-2014  . History of diabetes mellitus     BORDERLINE PRIOR TO GASTRIC BAND--  NO LONGER ISSUE  . Wears glasses   . Hypothyroidism, postsurgical   . Diabetes mellitus without complication ([place])     borderline for years; resolvedd with diet and weight loss     Home Meds:  Outpatient Prescriptions Prior to Visit  Medication Sig Dispense Refill  . docusate sodium (COLACE) 100 MG capsule  Take 100 mg by mouth 2 (two) times daily  as needed for mild constipation.     . hydrochlorothiazide (HYDRODIURIL) 25 MG tablet TAKE 1 TABLET BY MOUTH DAILY. 90 tablet 1  . levothyroxine (SYNTHROID, LEVOTHROID) 100 MCG tablet TAKE 1 TABLET (100 MCG TOTAL) BY MOUTH DAILY 90 tablet 0  . lisinopril (PRINIVIL,ZESTRIL) 20 MG tablet TAKE 1 TABLET BY MOUTH DAILY. 90 tablet 1  . Multiple Minerals-Vitamins (CALCIUM CITRATE PLUS/MAGNESIUM PO) Take 2 each by mouth 2 (two) times daily. 600mg /1500iu/120mg     . simvastatin (ZOCOR) 10 MG tablet TAKE 1 TABLET BY MOUTH AT BEDTIME. 90 tablet 1  . clonazePAM (KLONOPIN) 0.5 MG tablet Take 1 tablet (0.5 mg total) by mouth at bedtime. 30 tablet 2   No facility-administered medications prior to visit.     Allergies:  Allergies  Allergen Reactions  . Codeine Itching  . Metoprolol Rash    Social History   Social History  . Marital Status: Married    Spouse Name: N/A  . Number of Children: N/A  . Years of Education: N/A   Occupational History  . Not on file.   Social History Main Topics  . Smoking status: Former Smoker -- 1.00 packs/day for 15 years    Types: Cigarettes    Quit date: 02/22/1992  . Smokeless tobacco: Never Used  . Alcohol Use: No  . Drug Use: No  . Sexual Activity: Not on file   Other Topics Concern  . Not on file   Social History Narrative    Family History  Problem Relation Age of Onset  . Cancer Maternal Grandfather     colon     Review of Systems:  See HPI for pertinent ROS. All other ROS negative.    Physical Exam: Blood pressure 116/84, pulse 84, temperature 98.9 F (37.2 C), temperature source Oral, resp. rate 18, weight 166 lb (75.297 kg)., Body mass index is 32.42 kg/(m^2). General: WNWD WF. Appears in no acute distress. Neck: Supple. No thyromegaly. No lymphadenopathy. No carotid bruit. Lungs: Clear bilaterally to auscultation without wheezes, rales, or rhonchi. Breathing is unlabored. Heart: RRR with S1 S2. No murmurs, rubs, or gallops. Abdomen:  Soft, non-tender, non-distended with normoactive bowel sounds. No hepatomegaly. No rebound/guarding. No obvious abdominal masses. Musculoskeletal:  Strength and tone normal for age. Extremities/Skin: Warm and dry. No edema. No rash. Neuro: Alert and oriented X 3. Moves all extremities spontaneously. Gait is normal. CNII-XII grossly in tact. Psych:  Responds to questions appropriately with a normal affect.     ASSESSMENT AND PLAN:  60 y.o. year old female with   1. Insomnia Increase dose of clonazepam from 0.5mg  to 1mg .  - clonazePAM (KLONOPIN) 1 MG tablet; Take 1 tablet (1 mg total) by mouth daily.  Dispense: 30 tablet; Refill: 0  2. Weight loss counseling, encounter for Gave her one Rx to fill now.  Gave her a 2nd Rx that says "Do not fill until 09/02/2015" She will continue healthy diet--we discussed examples of what to eat - phentermine 37.5 MG capsule; Take 1 capsule (37.5 mg total) by mouth every morning.  Dispense: 30 capsule; Refill: 0   THE FOLLOWING IS COPIED FORM HER OV NOTE 06/2015--THE FOLLOWING WERE NOT ADDRESSED AT OV 08/03/2015:  Essential hypertension Blood Pressure is now controlled and at goal. Will check BMET to monitor lab.  She only uses HCTZ as needed for swelling. Also, discussed that even when she does take this to only take half of a pill.  Hypothyroidism,  unspecified hypothyroidism type - TSH   Hx of laparoscopic gastric banding --- She had a history of diabetes/hyperglycemia prior to the lap band. However since that procedure, blood sugar and A1c's have been normal.  Generalized anxiety disorder Currently stable off of medications  Insomnia Given her shift change and lack of sleep, discussed meds.  Discussed Ambien.  She prefers a med to help her "wind down" after work so she can sleep and see how that works for her.  - clonazePAM (KLONOPIN) 0.5 MG tablet; Take 1 tablet (0.5 mg total) by mouth at bedtime.  Dispense: 30 tablet; Refill: 2------See NOTE  ABOVE---HAD F/U OV 08/03/2015----DOSE INCREASED  Hyperlipidemia On Simvastatin. Check lab to monitor. - COMPLETE METABOLIC PANEL WITH GFR - Lipid panel  Bilateral edema of lower extremity She uses HCTZ as needed for lower stomach edema. She is to use only a half of a pill when needed.  Follow up office visit 6 months or sooner if needed.     [address] [place], [place], [facility] 08/03/2015 1:38 PM

## 2015-08-26 ENCOUNTER — Other Ambulatory Visit: Payer: Self-pay | Admitting: Physician Assistant

## 2015-08-26 NOTE — Telephone Encounter (Signed)
Approved. #[id number]. 

## 2015-08-26 NOTE — Telephone Encounter (Signed)
LRF 08/03/15 #30   LOV 08/03/15  OK refill??

## 2015-08-27 NOTE — Telephone Encounter (Signed)
rx called in

## 2015-08-31 MED FILL — clonazePAM 1 MG TABS: 1 | 30 days supply | Qty: 30 | Fill #0

## 2015-09-14 MED FILL — PHENTERMINE 37.5 MG TABLET: 37.5 | 30 days supply | Qty: 30 | Fill #0

## 2015-09-15 ENCOUNTER — Other Ambulatory Visit: Payer: Self-pay | Admitting: Obstetrics and Gynecology

## 2015-09-15 DIAGNOSIS — Z1231 Encounter for screening mammogram for malignant neoplasm of breast: Secondary | ICD-10-CM

## 2015-09-18 ENCOUNTER — Other Ambulatory Visit: Payer: Self-pay | Admitting: Physician Assistant

## 2015-09-18 MED FILL — HYDROCHLOROTHIAZIDE 25 MG T: 25 | 90 days supply | Qty: 90 | Fill #0

## 2015-09-18 MED FILL — LEVOTHYROXINE 100 MCG TAB: 100 | 90 days supply | Qty: 90 | Fill #1

## 2015-09-18 NOTE — Telephone Encounter (Signed)
Medication refilled per protocol. 

## 2015-09-29 MED FILL — SIMVASTATIN 10 MG TABLET: 10 | 90 days supply | Qty: 90 | Fill #1

## 2015-09-29 MED FILL — clonazePAM 1 MG TABS: 1 | 30 days supply | Qty: 30 | Fill #1

## 2015-10-23 ENCOUNTER — Ambulatory Visit
Admission: RE | Admit: 2015-10-23 | Discharge: 2015-10-23 | Disposition: A | Discharge status: Home / Self Care | Payer: 59 | Source: Ambulatory Visit | Attending: Obstetrics and Gynecology | Admitting: Obstetrics and Gynecology

## 2015-10-23 DIAGNOSIS — Z1231 Encounter for screening mammogram for malignant neoplasm of breast: Secondary | ICD-10-CM

## 2015-10-28 ENCOUNTER — Other Ambulatory Visit: Payer: Self-pay | Admitting: Physician Assistant

## 2015-10-28 MED FILL — clonazePAM 1 MG TABS: 1 | 30 days supply | Qty: 30 | Fill #2

## 2015-10-29 MED FILL — LISINOPRIL 20 MG TABLET: 20 | 90 days supply | Qty: 90 | Fill #0

## 2015-11-30 DIAGNOSIS — Z01419 Encounter for gynecological examination (general) (routine) without abnormal findings: Secondary | ICD-10-CM | POA: Diagnosis not present

## 2015-11-30 DIAGNOSIS — Z6831 Body mass index (BMI) 31.0-31.9, adult: Secondary | ICD-10-CM | POA: Diagnosis not present

## 2015-11-30 MED FILL — ZOLPIDEM TARTRATE 10 MG TAB: 10 | 90 days supply | Qty: 90 | Fill #0

## 2015-12-01 ENCOUNTER — Telehealth: Payer: Self-pay | Admitting: Physician Assistant

## 2015-12-01 NOTE — Telephone Encounter (Signed)
Patient requesting a 90 day supply of her clonazepam if possible  [phone] Lemmie Evens) [facility]

## 2015-12-02 ENCOUNTER — Other Ambulatory Visit: Payer: Self-pay | Admitting: Physician Assistant

## 2015-12-02 NOTE — Telephone Encounter (Signed)
Ok to refill??  Last office visit 08/03/2015.  Last refill 08/27/2015.

## 2015-12-03 MED FILL — clonazePAM 1 MG TABS: 1 | 30 days supply | Qty: 30 | Fill #0

## 2015-12-03 NOTE — Telephone Encounter (Signed)
Medication called to pharmacy. 

## 2015-12-03 NOTE — Telephone Encounter (Signed)
Approved. #[id number]. 

## 2015-12-04 NOTE — Telephone Encounter (Signed)
Tried calling Pt no answer .  There was an RX refill done on 10-12 MBD approved 30 day supply

## 2015-12-11 ENCOUNTER — Telehealth: Payer: Self-pay | Admitting: Physician Assistant

## 2015-12-11 DIAGNOSIS — Z0000 Encounter for general adult medical examination without abnormal findings: Secondary | ICD-10-CM

## 2015-12-11 NOTE — Telephone Encounter (Signed)
Ms Ivan Croft calling back wondering why she did not get there 90 day refill on her med that she asked for  [phone]

## 2015-12-14 ENCOUNTER — Other Ambulatory Visit: Payer: Self-pay

## 2015-12-14 MED ORDER — CLONAZEPAM 1 MG PO TABS: 1.0000 mg | ORAL_TABLET | Freq: Every day | ORAL | 0 refills | Status: DC

## 2015-12-14 NOTE — Telephone Encounter (Signed)
Labs ordered Rx called in

## 2015-12-14 NOTE — Telephone Encounter (Signed)
Pt is req 90 supply on clonazePam . Pt states she is traveling  from [place] to [employer].  Last OV 6-12 Last refill  10-12 Ok to refill for 90 days?  Pt also would like to hve labs drawn prior to her appt Which labs BMP,CMP, FLP,TSH?

## 2015-12-14 NOTE — Telephone Encounter (Signed)
Can fill the clonazepam for #90+0 For the lab order--- CBC, CMET, FLP, TSH, vitamin D

## 2015-12-15 DIAGNOSIS — Z13820 Encounter for screening for osteoporosis: Secondary | ICD-10-CM | POA: Diagnosis not present

## 2015-12-15 NOTE — Telephone Encounter (Signed)
Refill req for 90 supply

## 2015-12-17 MED FILL — HYDROCHLOROTHIAZIDE 25 MG T: 25 | 90 days supply | Qty: 90 | Fill #1

## 2015-12-17 MED FILL — LEVOTHYROXINE 100 MCG TAB: 100 | 90 days supply | Qty: 90 | Fill #0

## 2015-12-22 DIAGNOSIS — H43813 Vitreous degeneration, bilateral: Secondary | ICD-10-CM | POA: Diagnosis not present

## 2015-12-22 DIAGNOSIS — H40003 Preglaucoma, unspecified, bilateral: Secondary | ICD-10-CM | POA: Diagnosis not present

## 2015-12-22 DIAGNOSIS — H04123 Dry eye syndrome of bilateral lacrimal glands: Secondary | ICD-10-CM | POA: Diagnosis not present

## 2016-01-08 MED FILL — clonazePAM 1 MG TABS: 1 | 90 days supply | Qty: 90 | Fill #0

## 2016-01-29 ENCOUNTER — Other Ambulatory Visit: Payer: 59

## 2016-01-29 DIAGNOSIS — Z0000 Encounter for general adult medical examination without abnormal findings: Secondary | ICD-10-CM | POA: Diagnosis not present

## 2016-01-29 LAB — CBC WITH DIFFERENTIAL/PLATELET
BASOS ABS: 0 {cells}/uL (ref 0–200)
Basophils Relative: 0 %
EOS PCT: 2 %
Eosinophils Absolute: 118 cells/uL (ref 15–500)
HEMATOCRIT: 38.3 % (ref 35.0–45.0)
HEMOGLOBIN: 12.6 g/dL (ref 12.0–15.0)
LYMPHS ABS: 2006 {cells}/uL (ref 850–3900)
Lymphocytes Relative: 34 %
MCH: 28.2 pg (ref 27.0–33.0)
MCHC: 32.9 g/dL (ref 32.0–36.0)
MCV: 85.7 fL (ref 80.0–100.0)
MPV: 9.7 fL (ref 7.5–12.5)
Monocytes Absolute: 354 cells/uL (ref 200–950)
Monocytes Relative: 6 %
NEUTROS PCT: 58 %
Neutro Abs: 3422 cells/uL (ref 1500–7800)
Platelets: 255 10*3/uL (ref 140–400)
RBC: 4.47 MIL/uL (ref 3.80–5.10)
RDW: 14.2 % (ref 11.0–15.0)
WBC: 5.9 10*3/uL (ref 3.8–10.8)

## 2016-01-29 LAB — COMPLETE METABOLIC PANEL WITH GFR
ALBUMIN: 4.5 g/dL (ref 3.6–5.1)
ALK PHOS: 63 U/L (ref 33–130)
ALT: 21 U/L (ref 6–29)
AST: 19 U/L (ref 10–35)
BUN: 17 mg/dL (ref 7–25)
CALCIUM: 9 mg/dL (ref 8.6–10.4)
CO2: 25 mmol/L (ref 20–31)
Chloride: 102 mmol/L (ref 98–110)
Creat: 0.79 mg/dL (ref 0.50–1.05)
GFR, EST NON AFRICAN AMERICAN: 82 mL/min (ref >=60)
Glucose, Bld: 75 mg/dL (ref 70–99)
POTASSIUM: 4.2 mmol/L (ref 3.5–5.3)
SODIUM: 140 mmol/L (ref 135–146)
Total Bilirubin: 0.5 mg/dL (ref 0.2–1.2)
Total Protein: 6.9 g/dL (ref 6.1–8.1)

## 2016-01-29 LAB — LIPID PANEL
CHOL/HDL RATIO: 3.1 ratio (ref <5.0)
CHOLESTEROL: 215 mg/dL — AB (ref <200)
HDL: 69 mg/dL (ref >50)
LDL Cholesterol: 129 mg/dL — ABNORMAL HIGH (ref <100)
Triglycerides: 87 mg/dL (ref <150)
VLDL: 17 mg/dL (ref <30)

## 2016-01-29 LAB — TSH: TSH: 4.7 mIU/L — ABNORMAL HIGH

## 2016-01-30 LAB — VITAMIN D 25 HYDROXY (VIT D DEFICIENCY, FRACTURES): Vit D, 25-Hydroxy: 33 ng/mL (ref 30–100)

## 2016-02-01 ENCOUNTER — Other Ambulatory Visit: Payer: 59

## 2016-02-03 ENCOUNTER — Ambulatory Visit (INDEPENDENT_AMBULATORY_CARE_PROVIDER_SITE_OTHER): Payer: 59 | Admitting: Physician Assistant

## 2016-02-03 ENCOUNTER — Telehealth: Payer: Self-pay

## 2016-02-03 ENCOUNTER — Encounter: Payer: Self-pay | Admitting: Physician Assistant

## 2016-02-03 ENCOUNTER — Ambulatory Visit: Payer: 59 | Admitting: Physician Assistant

## 2016-02-03 VITALS — BP 100/72 | HR 82 | Temp 98.6°F | Resp 18 | Wt 164.0 lb

## 2016-02-03 DIAGNOSIS — E785 Hyperlipidemia, unspecified: Secondary | ICD-10-CM

## 2016-02-03 DIAGNOSIS — G4700 Insomnia, unspecified: Secondary | ICD-10-CM

## 2016-02-03 DIAGNOSIS — I10 Essential (primary) hypertension: Secondary | ICD-10-CM | POA: Diagnosis not present

## 2016-02-03 DIAGNOSIS — Z0000 Encounter for general adult medical examination without abnormal findings: Secondary | ICD-10-CM | POA: Diagnosis not present

## 2016-02-03 DIAGNOSIS — Z9889 Other specified postprocedural states: Secondary | ICD-10-CM | POA: Diagnosis not present

## 2016-02-03 DIAGNOSIS — E039 Hypothyroidism, unspecified: Secondary | ICD-10-CM | POA: Diagnosis not present

## 2016-02-03 DIAGNOSIS — F411 Generalized anxiety disorder: Secondary | ICD-10-CM

## 2016-02-03 MED ORDER — DICLOFENAC SODIUM 1 % TD GEL: TRANSDERMAL | 2 refills | Status: DC

## 2016-02-03 MED ORDER — HYDROCHLOROTHIAZIDE 25 MG PO TABS
25.0000 mg | ORAL_TABLET | Freq: Every day | ORAL | 1 refills | Status: DC
Start: 2016-02-03 — End: 2016-09-30

## 2016-02-03 MED FILL — LISINOPRIL 20 MG TABLET: 20 | 90 days supply | Qty: 90 | Fill #1

## 2016-02-03 NOTE — Telephone Encounter (Signed)
Apply 2 grams 4 times per day to affected area.

## 2016-02-03 NOTE — Telephone Encounter (Signed)
Pharmacy states they need to know how many grams of the Diclofenac sodium (VOLTAREN) 1 % gel the patient is to apply daily, as well as how often? The pharmacy states the tube comes with a measuring device where you squeeze out.

## 2016-02-03 NOTE — Progress Notes (Signed)
Patient ID: Cathy Howell MRN: [id number], DOB: [birth year], 60 y.o. Date of Encounter: 02/03/2016,   Chief Complaint: Physical (CPE)  HPI: 60 y.o. y/o female  here for CPE.   She has no specific concerns to address today.  She is wanting a refill on diclofenac gel. Says that this was prescribed by orthopedics when she had her rotator cuff surgery. States that she has aches and pains in multiple joints and would like to have this available to use as needed to those areas of pain.  He is taking blood pressure medications as directed. No lightheadedness or other adverse effects.  She is taking cholesterol medication as directed. No myalgias or other adverse effects.  She is taking her thyroid medicine. However says that her work schedule has been changed multiple times and that could be affecting this. Currently is waking up at 1:30 in the morning to go into work in the middle of the night.  Review of Systems: Consitutional: No fever, chills, fatigue, night sweats, lymphadenopathy. No significant/unexplained weight changes. Eyes: No visual changes, eye redness, or discharge. ENT/Mouth: No ear pain, sore throat, nasal drainage, or sinus pain. Cardiovascular: No chest pressure,heaviness, tightness or squeezing, even with exertion. No increased shortness of breath or dyspnea on exertion.No palpitations, edema, orthopnea, PND. Respiratory: No cough, hemoptysis, SOB, or wheezing. Gastrointestinal: No anorexia, dysphagia, reflux, pain, nausea, vomiting, hematemesis, diarrhea, constipation, BRBPR, or melena. Breast: No mass, nodules, bulging, or retraction. No skin changes or inflammation. No nipple discharge. No lymphadenopathy. Genitourinary: No dysuria, hematuria, incontinence, vaginal discharge, pruritis, burning, abnormal bleeding, or pain. Musculoskeletal: See HPI. Skin: No rash, pruritis, or concerning lesions. Neurological: No headache, dizziness, syncope, seizures, tremors, memory  loss, coordination problems, or paresthesias. Psychological: No anxiety, depression, hallucinations, SI/HI. Endocrine: No polydipsia, polyphagia, polyuria, or known diabetes.No increased fatigue. No palpitations/rapid heart rate. No significant/unexplained weight change. All other systems were reviewed and are otherwise negative.  Past Medical History:  Diagnosis Date  . Acute bronchitis    dx 05-22-2014---  per pt resolved no productive cough as of 05-30-2014  . Arthritis   . Depression   . Diabetes mellitus without complication ([place])    borderline for years; resolvedd with diet and weight loss  . Generalized anxiety disorder   . GERD (gastroesophageal reflux disease)   . History of diabetes mellitus    BORDERLINE PRIOR TO GASTRIC BAND--  NO LONGER ISSUE  . Hyperlipidemia   . Hypertension   . Hypothyroidism, postsurgical   . Left rotator cuff tear   . Wears glasses      Past Surgical History:  Procedure Laterality Date  . BROW LIFT Bilateral 07/03/2014   Procedure: BILATERAL UPPER LEFT BLEPHAROPLASTY;  Surgeon: Crissie Reese, MD;  Location: [place];  Service: Plastics;  Laterality: Bilateral;  . HIATAL HERNIA REPAIR  06/13/2011   Procedure: LAPAROSCOPIC REPAIR OF HIATAL HERNIA;  Surgeon: Edward Jolly, MD;  Location[facility];  Service: General;;  . JOINT REPLACEMENT    . LAPAROSCOPIC GASTRIC BANDING  06/13/2011   Procedure: LAPAROSCOPIC GASTRIC BANDING;  Surgeon: Edward Jolly, MD;  Location: [facility];  Service: General;  Laterality: N/A;  . LEFT SHOULDER ARTHROSCOPY/  SAD/  ACROMIOPLASTY/  BURSECTOMY/  CA LIGAMENT RELEASE/  DCR/  ROTATOR CUFF REPAIR  09-21-2006  . MASS EXCISION Left 07/03/2014   Procedure: EXCISION LESION ON UPPER LEFT EYE LID;  Surgeon: Crissie Reese, MD;  Location: [place];  Service: Plastics;  Laterality: Left;  . SHOULDER ARTHROSCOPY WITH  ROTATOR CUFF REPAIR Left 06/03/2014   Procedure: LEFT SHOULDER ARTHROSCOPY WITH ROTATOR CUFF REPAIR;  Surgeon: Sydnee Cabal, MD;  Location: [facility];  Service: Orthopedics;  Laterality: Left;  . THYROIDECTOMY Bilateral 2007   benign mass  . TONSILLECTOMY  1960's  . TOTAL HIP ARTHROPLASTY Bilateral right 08-21-2006/   left  2005  . TUBAL LIGATION  1996    Home Meds:  Outpatient Medications Prior to Visit  Medication Sig Dispense Refill  . clonazePAM (KLONOPIN) 1 MG tablet Take 1 tablet (1 mg total) by mouth daily. 90 tablet 0  . levothyroxine (SYNTHROID, LEVOTHROID) 100 MCG tablet TAKE 1 TABLET (100 MCG TOTAL) BY MOUTH DAILY 90 tablet 0  . lisinopril (PRINIVIL,ZESTRIL) 20 MG tablet TAKE 1 TABLET BY MOUTH DAILY. 90 tablet PRN  . Multiple Minerals-Vitamins (CALCIUM CITRATE PLUS/MAGNESIUM PO) Take 2 each by mouth 2 (two) times daily. 600mg /1500iu/120mg     . simvastatin (ZOCOR) 10 MG tablet TAKE 1 TABLET BY MOUTH AT BEDTIME. 90 tablet PRN  . hydrochlorothiazide (HYDRODIURIL) 25 MG tablet TAKE 1 TABLET BY MOUTH DAILY. 90 tablet 1  . docusate sodium (COLACE) 100 MG capsule Take 100 mg by mouth 2 (two) times daily as needed for mild constipation.     . phentermine 37.5 MG capsule Take 1 capsule (37.5 mg total) by mouth every morning. (Patient not taking: Reported on 02/03/2016) 30 capsule 0  . levothyroxine (SYNTHROID, LEVOTHROID) 100 MCG tablet TAKE 1 TABLET BY MOUTH DAILY 90 tablet PRN   No facility-administered medications prior to visit.     Allergies:  Allergies  Allergen Reactions  . Codeine Itching  . Metoprolol Rash    Social History   Social History  . Marital status: Married    Spouse name: N/A  . Number of children: N/A  . Years of education: N/A   Occupational History  . Not on file.   Social History Main Topics  . Smoking status: Former Smoker    Packs/day: 1.00    Years: 15.00    Types: Cigarettes    Quit date: 02/22/1992  . Smokeless tobacco: Never Used  . Alcohol use No  . Drug use: No  . Sexual activity: Not on file   Other Topics Concern  . Not on  file   Social History Narrative  . No narrative on file    Family History  Problem Relation Age of Onset  . Cancer Maternal Grandfather     colon    Physical Exam: Blood pressure 100/72, pulse 82, temperature 98.6 F (37 C), temperature source Oral, resp. rate 18, weight 164 lb (74.4 kg), SpO2 98 %., Body mass index is 32.03 kg/m.   abdominal circumference 42 inches. General: Obese WF. Appears in no acute distress. HEENT: Normocephalic, atraumatic. Conjunctiva pink, sclera non-icteric. Pupils 2 mm constricting to 1 mm, round, regular, and equally reactive to light and accomodation. EOMI. Internal auditory canal clear. TMs with good cone of light and without pathology. Nasal mucosa pink. Nares are without discharge. No sinus tenderness. Oral mucosa pink Neck: Supple. Trachea midline. No thyromegaly. Full ROM. No lymphadenopathy.No Carotid Bruits. Lungs: Clear to auscultation bilaterally without wheezes, rales, or rhonchi. Breathing is of normal effort and unlabored. Cardiovascular: RRR with S1 S2. No murmurs, rubs, or gallops. Distal pulses 2+ symmetrically. No carotid or abdominal bruits. Breast: Per Gyn Abdomen: Soft, non-tender, non-distended with normoactive bowel sounds. No hepatosplenomegaly or masses. No rebound/guarding. No CVA tenderness. No hernias.  Genitourinary: Per Gyn Musculoskeletal: Full range of  motion and 5/5 strength throughout. Without swelling, atrophy, tenderness, crepitus, or warmth. Extremities without clubbing, cyanosis, or edema.  Skin: Warm and moist without erythema, ecchymosis, wounds, or rash. Neuro: A+Ox3. CN II-XII grossly intact. Moves all extremities spontaneously. Full sensation throughout. Normal gait. DTR 2+ throughout upper and lower extremities.  Psych:  Responds to questions appropriately with a normal affect.   Assessment/Plan:  60 y.o. y/o female here for CPE 1. Encounter for preventive health examination  She needs to turn and information  to get insurance discount. I have given her a copy of labs that she can turn in for the needed results. As well she has height weight and BMI documented.  I also documented abdominal circumference 42 inches.  A. Screening Labs: She recently came and had fasting labs done. Printed a copy and gave her today and also reviewed with her today. TSH is slightly out of normal range. She states that her work schedule has changed multiple times recently and currently she is waking up in the middle of the night to go to work at 1:30 AM. Roney Jaffe that this may be affecting things. Also she plans to lose some weight and feels that if we adjust the dose now and then she loses weight and the dose weight need to be changed again. Opts to just into new current dose for now and monitor and if still not normal range at next check then adjust dose. Other labs are good.  B. Pap: She sees Dr. Helane Rima at [facility]. Has had Pap smear, Pelvic Exam there.  C. Screening Mammogram: She has mammogram performed at Dr. Eulogio Ditch. This is up to date.  D. DEXA/BMD:  She had DEXA scan performed at Dr. Gracy Racer at [facility]. They are managing this.  E. Colorectal Cancer Screening: She reports that Dr. Watt Climes did her colonoscopy. She says that it showed 2 polyps and she was told to repeat 5 years. She thinks that next colonoscopy is due this upcoming August. She is aware and will follow-up with Dr. Perley Jain office to verify this. In the computer I see something to do with a colonoscopy 10/15/12 and if that was the date it was done and she is post wait 5 years -- then this is not due until 10/15/2017. Pt aware and pt plans to follow-up regarding this.  F. Immunizations:  Influenza: She is at [place]. She has had her flu vaccine for this season. Tetanus: [employer] employee. Patient states that she knows that they have her tetanus up-to-date but just doesn't know the exact date to get me. Pneumococcal: She has no indication to require this  until age 72. Zostavax: Will discuss this at age 8.   2. Essential hypertension Blood Pressure is stable/controlled/at goal. BMEY normal. Continue current medicines.  3. Hypothyroidism, unspecified type She recently came and had labs drawn. Discussed these results today. TSH is slightly out of normal range. She states that her work schedule has changed multiple times recently and currently she is waking up in the middle of the night to go to work at 1:30 AM. Roney Jaffe that this may be affecting things. Also she plans to lose some weight and feels that if we adjust the dose now and then she loses weight and the dose weight need to be changed again. Opts to just into new current dose for now and monitor and if still not normal range at next check then adjust dose. Other labs are good.   4. Morbid obesity ([place]) She  is aware of recent weight gain and is planning to lose weight with diet and exercise changes.  5. Generalized anxiety disorder Stable/controlled. She uses clonazepam as needed.  6. Insomnia, unspecified type This is stable/controlled. She uses clonazepam as needed for this  7. Hyperlipidemia, unspecified hyperlipidemia type She just came in had fasting labs drawn. Review those results today. Continue current dose of simvastatin.  8. S/P rotator cuff repair Today I did give her a refill on the diclofenac gel to use to affected areas as directed.  Routine follow-up visit in 6 months or sooner if needed.   Marin Olp [place], [place], [facility] 02/03/2016 1:15 PM

## 2016-02-04 MED ORDER — DICLOFENAC SODIUM 1 % TD GEL: TRANSDERMAL | 2 refills | Status: DC

## 2016-02-04 MED FILL — DICLOFENAC SODIUM 1% GEL: 1 | 13 days supply | Qty: 100 | Fill #0

## 2016-02-04 NOTE — Telephone Encounter (Signed)
rx filled

## 2016-02-29 DIAGNOSIS — Z96641 Presence of right artificial hip joint: Secondary | ICD-10-CM | POA: Diagnosis not present

## 2016-02-29 DIAGNOSIS — M25551 Pain in right hip: Secondary | ICD-10-CM | POA: Diagnosis not present

## 2016-02-29 DIAGNOSIS — Z471 Aftercare following joint replacement surgery: Secondary | ICD-10-CM | POA: Diagnosis not present

## 2016-02-29 MED FILL — ZOLPIDEM TARTRATE 10 MG TAB: 10 | 90 days supply | Qty: 90 | Fill #1

## 2016-03-01 MED FILL — HYDROCHLOROTHIAZIDE 25 MG T: 25 | 90 days supply | Qty: 90 | Fill #0

## 2016-03-04 ENCOUNTER — Telehealth: Payer: Self-pay | Admitting: Physician Assistant

## 2016-03-04 DIAGNOSIS — Z713 Dietary counseling and surveillance: Secondary | ICD-10-CM

## 2016-03-04 NOTE — Telephone Encounter (Signed)
Patient is calling to see if she can get maybe 2 months of phentermine if possible

## 2016-03-07 NOTE — Telephone Encounter (Signed)
Ok to refill 

## 2016-03-08 MED ORDER — PHENTERMINE HCL 37.5 MG PO CAPS: 37.5000 mg | ORAL_CAPSULE | ORAL | 0 refills | Status: DC

## 2016-03-08 MED FILL — SIMVASTATIN 10 MG TABLET: 10 | 90 days supply | Qty: 90 | Fill #0

## 2016-03-08 MED FILL — LEVOTHYROXINE 100 MCG TABLE: 100 | 90 days supply | Qty: 90 | Fill #1

## 2016-03-08 NOTE — Telephone Encounter (Signed)
Rx printed called pt [email or web address]

## 2016-03-08 NOTE — Telephone Encounter (Signed)
Reviewed chart. This was Rxed in 07/2015.  Tell pt that I will give 2 months worth but that is all.  During that 2 months she must develop healthy diet, exercise schedule and maintain this.  This Rx has to be printed----telll her I will sign on Wednesday, if the office opens (snow) Print 2 Rxes for  Phentermine 37.5mg  1 po QAM # 30 +0

## 2016-03-11 NOTE — Telephone Encounter (Signed)
Pt aware she need to develop healthy diet, exercise schedule

## 2016-03-14 ENCOUNTER — Telehealth: Payer: Self-pay | Admitting: Physician Assistant

## 2016-03-14 NOTE — Telephone Encounter (Signed)
Rx can be picked up after 2pm pt aware

## 2016-03-14 NOTE — Telephone Encounter (Signed)
Pt needs a refill on her diet pill, she doesn't have the med with her and doesn't know the name of the pill.

## 2016-03-15 MED FILL — PHENTERMINE 37.5 MG TABLET: 37.5 | 30 days supply | Qty: 30 | Fill #0

## 2016-03-30 ENCOUNTER — Other Ambulatory Visit: Payer: Self-pay | Admitting: Physician Assistant

## 2016-03-30 NOTE — Telephone Encounter (Signed)
Approved. #90+ 0. 

## 2016-03-30 NOTE — Telephone Encounter (Signed)
rx phoned in

## 2016-03-30 NOTE — Telephone Encounter (Signed)
Ok to refill 

## 2016-04-06 MED FILL — clonazePAM 1 MG TABS: 1 | 90 days supply | Qty: 90 | Fill #0

## 2016-05-04 ENCOUNTER — Ambulatory Visit (INDEPENDENT_AMBULATORY_CARE_PROVIDER_SITE_OTHER): Payer: 59 | Admitting: Physician Assistant

## 2016-05-04 ENCOUNTER — Encounter: Payer: Self-pay | Admitting: Physician Assistant

## 2016-05-04 VITALS — BP 102/80 | HR 88 | Temp 97.9°F | Resp 14 | Wt 166.4 lb

## 2016-05-04 DIAGNOSIS — B349 Viral infection, unspecified: Secondary | ICD-10-CM | POA: Diagnosis not present

## 2016-05-04 MED FILL — LISINOPRIL 20 MG TABLET: 20 | 90 days supply | Qty: 90 | Fill #2

## 2016-05-04 NOTE — Progress Notes (Signed)
Patient ID: Cathy Howell MRN: [id number], DOB: Nov 16, 1955, 61 y.o. Date of Encounter: 05/04/2016, 4:22 PM    Chief Complaint:  Chief Complaint  Patient presents with  . Headache    x3days  . Nausea  . Diarrhea  . Fatigue     HPI: 61 y.o. year old female presents with above.   States that her symptoms started Sunday 05/01/2016. Says at that time she started to develop headache, nausea, diarrhea. Says that she is feeling weak and run down. Says that she has had some low-grade fever. Has drainage causing her to be a little hoarse. Has had no appetite. Has had body aches and feeling cold. Has had no vomiting. No longer having diarrhea but thinks that her system is "empty" as she has had no appetite. She has been drinking fennel tea and water and that is about all that has gotten down. Has had no localized abdominal pain.      Home Meds:   Outpatient Medications Prior to Visit  Medication Sig Dispense Refill  . clonazePAM (KLONOPIN) 1 MG tablet TAKE 1 TABLET BY MOUTH ONCE DAILY 90 tablet 0  . diclofenac sodium (VOLTAREN) 1 % GEL Apply 2 grams 4 times per day to affected area. 1 Tube 2  . docusate sodium (COLACE) 100 MG capsule Take 100 mg by mouth 2 (two) times daily as needed for mild constipation.     . hydrochlorothiazide (HYDRODIURIL) 25 MG tablet Take 1 tablet (25 mg total) by mouth daily. 90 tablet 1  . levothyroxine (SYNTHROID, LEVOTHROID) 100 MCG tablet TAKE 1 TABLET (100 MCG TOTAL) BY MOUTH DAILY 90 tablet 0  . lisinopril (PRINIVIL,ZESTRIL) 20 MG tablet TAKE 1 TABLET BY MOUTH DAILY. 90 tablet PRN  . Multiple Minerals-Vitamins (CALCIUM CITRATE PLUS/MAGNESIUM PO) Take 2 each by mouth 2 (two) times daily. 600mg /1500iu/120mg     . phentermine 37.5 MG capsule Take 1 capsule (37.5 mg total) by mouth every morning. 30 capsule 0  . simvastatin (ZOCOR) 10 MG tablet TAKE 1 TABLET BY MOUTH AT BEDTIME. 90 tablet PRN   No facility-administered medications prior to visit.      Allergies:  Allergies  Allergen Reactions  . Codeine Itching  . Metoprolol Rash      Review of Systems: See HPI for pertinent ROS. All other ROS negative.    Physical Exam: Blood pressure 102/80, pulse 88, temperature 97.9 F (36.6 C), temperature source Oral, resp. rate 14, weight 166 lb 6.4 oz (75.5 kg), SpO2 98 %., Body mass index is 32.5 kg/m. General:  WF. Appears in no acute distress. HEENT: Normocephalic, atraumatic, eyes without discharge, sclera non-icteric, nares are without discharge. Bilateral auditory canals clear, TM's are without perforation, pearly grey and translucent with reflective cone of light bilaterally. Oral cavity moist, posterior pharynx without exudate, erythema, peritonsillar abscess.  Neck: Supple. No thyromegaly. No lymphadenopathy. Lungs: Clear bilaterally to auscultation without wheezes, rales, or rhonchi. Breathing is unlabored. Heart: Regular rhythm. No murmurs, rubs, or gallops. Abdomen: Soft, non-tender, non-distended with normoactive bowel sounds. No hepatomegaly. No rebound/guarding. No obvious abdominal masses. No area of tenderness.  Msk:  Strength and tone normal for age. Extremities/Skin: Warm and dry.  Neuro: Alert and oriented X 3. Moves all extremities spontaneously. Gait is normal. CNII-XII grossly in tact. Psych:  Responds to questions appropriately with a normal affect.     ASSESSMENT AND PLAN:  61 y.o. year old female with  1. Viral illness She is to use over-the-counter medicines as needed for symptom  relief. Note given for out of work 3/12 through 3/16. Return Monday 3/19. If needed.   Marin Olp [place], [place], [facility] 05/04/2016 4:22 PM

## 2016-06-03 MED FILL — HYDROCHLOROTHIAZIDE 25 MG T: 25 | 90 days supply | Qty: 90 | Fill #1

## 2016-06-03 MED FILL — SIMVASTATIN 10 MG TABLET: 10 | 90 days supply | Qty: 90 | Fill #1

## 2016-06-03 MED FILL — PHENTERMINE 37.5 MG TABLET: 37.5 | 30 days supply | Qty: 30 | Fill #0

## 2016-06-03 MED FILL — LEVOTHYROXINE 100 MCG TABLE: 100 | 90 days supply | Qty: 90 | Fill #2

## 2016-06-03 MED FILL — ZOLPIDEM TARTRATE 10 MG TAB: 10 | 90 days supply | Qty: 90 | Fill #0

## 2016-07-06 ENCOUNTER — Other Ambulatory Visit: Payer: Self-pay

## 2016-07-06 MED ORDER — CLONAZEPAM 1 MG PO TABS: 1.0000 mg | ORAL_TABLET | Freq: Every day | ORAL | 2 refills | Status: DC

## 2016-07-06 MED FILL — clonazePAM 1 MG TABS: 1 | 90 days supply | Qty: 90 | Fill #0 | Status: TO

## 2016-07-06 NOTE — Telephone Encounter (Signed)
Rx called in 

## 2016-07-06 NOTE — Telephone Encounter (Signed)
Approved. # 90 + 2. 

## 2016-07-06 NOTE — Telephone Encounter (Signed)
Last OV 3/14 Last refill 2/7 Ok to refill?

## 2016-07-28 MED FILL — LISINOPRIL 20 MG TABLET: 20 | 90 days supply | Qty: 90 | Fill #3

## 2016-08-04 ENCOUNTER — Ambulatory Visit (INDEPENDENT_AMBULATORY_CARE_PROVIDER_SITE_OTHER): Payer: 59 | Admitting: Family Medicine

## 2016-08-04 ENCOUNTER — Encounter: Payer: Self-pay | Admitting: Family Medicine

## 2016-08-04 VITALS — BP 118/76 | HR 82 | Temp 98.3°F | Resp 16 | Ht 60.0 in | Wt 166.0 lb

## 2016-08-04 DIAGNOSIS — D171 Benign lipomatous neoplasm of skin and subcutaneous tissue of trunk: Secondary | ICD-10-CM

## 2016-08-04 NOTE — Progress Notes (Signed)
Subjective:    Patient ID: Cathy Howell, female    DOB: 04-19-1955, 61 y.o.   MRN: [id number]  HPI  Patient has a tender, itchy, subcutaneous mass on her lower left flank on her back. It irritates her particularly when her clothes rub against it. Is a proximally 2 cm. It is elliptical in shape. It is around the level of T12 on the left-hand side. It is subcutaneous. Past Medical History:  Diagnosis Date  . Acute bronchitis    dx 05-22-2014---  per pt resolved no productive cough as of 05-30-2014  . Arthritis   . Depression   . Diabetes mellitus without complication ([place])    borderline for years; resolvedd with diet and weight loss  . Generalized anxiety disorder   . GERD (gastroesophageal reflux disease)   . History of diabetes mellitus    BORDERLINE PRIOR TO GASTRIC BAND--  NO LONGER ISSUE  . Hyperlipidemia   . Hypertension   . Hypothyroidism, postsurgical   . Left rotator cuff tear   . Wears glasses    Past Surgical History:  Procedure Laterality Date  . BROW LIFT Bilateral 07/03/2014   Procedure: BILATERAL UPPER LEFT BLEPHAROPLASTY;  Surgeon: Crissie Reese, MD;  Location: [place];  Service: Plastics;  Laterality: Bilateral;  . HIATAL HERNIA REPAIR  06/13/2011   Procedure: LAPAROSCOPIC REPAIR OF HIATAL HERNIA;  Surgeon: Edward Jolly, MD;  [facility];  Service: General;;  . JOINT REPLACEMENT    . LAPAROSCOPIC GASTRIC BANDING  06/13/2011   Procedure: LAPAROSCOPIC GASTRIC BANDING;  Surgeon: Edward Jolly, MD;  [facility];  Service: General;  Laterality: N/A;  . LEFT SHOULDER ARTHROSCOPY/  SAD/  ACROMIOPLASTY/  BURSECTOMY/  CA LIGAMENT RELEASE/  DCR/  ROTATOR CUFF REPAIR  09-21-2006  . MASS EXCISION Left 07/03/2014   Procedure: EXCISION LESION ON UPPER LEFT EYE LID;  Surgeon: Crissie Reese, MD;  Location: [place];  Service: Plastics;  Laterality: Left;  . SHOULDER ARTHROSCOPY WITH ROTATOR CUFF REPAIR Left 06/03/2014   Procedure: LEFT SHOULDER ARTHROSCOPY WITH  ROTATOR CUFF REPAIR;  Surgeon: Sydnee Cabal, MD;  Location: [facility];  Service: Orthopedics;  Laterality: Left;  . THYROIDECTOMY Bilateral 2007   benign mass  . TONSILLECTOMY  1960's  . TOTAL HIP ARTHROPLASTY Bilateral right 08-21-2006/   left  2005  . TUBAL LIGATION  1996   Current Outpatient Prescriptions on File Prior to Visit  Medication Sig Dispense Refill  . clonazePAM (KLONOPIN) 1 MG tablet Take 1 tablet (1 mg total) by mouth daily. 90 tablet 2  . diclofenac sodium (VOLTAREN) 1 % GEL Apply 2 grams 4 times per day to affected area. 1 Tube 2  . docusate sodium (COLACE) 100 MG capsule Take 100 mg by mouth 2 (two) times daily as needed for mild constipation.     . hydrochlorothiazide (HYDRODIURIL) 25 MG tablet Take 1 tablet (25 mg total) by mouth daily. 90 tablet 1  . levothyroxine (SYNTHROID, LEVOTHROID) 100 MCG tablet TAKE 1 TABLET (100 MCG TOTAL) BY MOUTH DAILY 90 tablet 0  . lisinopril (PRINIVIL,ZESTRIL) 20 MG tablet TAKE 1 TABLET BY MOUTH DAILY. 90 tablet PRN  . Multiple Minerals-Vitamins (CALCIUM CITRATE PLUS/MAGNESIUM PO) Take 2 each by mouth 2 (two) times daily. 600mg /1500iu/120mg     . phentermine (ADIPEX-P) 37.5 MG tablet Take 37.5 mg by mouth as needed.  0  . simvastatin (ZOCOR) 10 MG tablet TAKE 1 TABLET BY MOUTH AT BEDTIME. 90 tablet PRN  . zolpidem (AMBIEN) 10  MG tablet Take 10 mg by mouth daily as needed.  1   No current facility-administered medications on file prior to visit.    Allergies  Allergen Reactions  . Codeine Itching  . Metoprolol Rash   Social History   Social History  . Marital status: Married    Spouse name: N/A  . Number of children: N/A  . Years of education: N/A   Occupational History  . Not on file.   Social History Main Topics  . Smoking status: Former Smoker    Packs/day: 1.00    Years: 15.00    Types: Cigarettes    Quit date: 02/22/1992  . Smokeless tobacco: Never Used  . Alcohol use No  . Drug use: No  . Sexual  activity: Not on file   Other Topics Concern  . Not on file   Social History Narrative  . No narrative on file     Review of Systems  All other systems reviewed and are negative.      Objective:   Physical Exam  Cardiovascular: Normal rate, regular rhythm and normal heart sounds.   Pulmonary/Chest: Effort normal and breath sounds normal. No respiratory distress. She has no wheezes. She has no rales.  Skin: No rash noted. No erythema.  Vitals reviewed. see hpi        Assessment & Plan:  Lipoma of back  Area was anesthetized 0.1% lidocaine with epinephrine. It was prepped and draped in sterile fashion. A 2.5 cm x 1 cm elliptical excision was made down to the subcutaneous mass. It was actually a lipoma. It was completely excised down to the subcutaneous fascia. The SQ space was then approximated with 2 simple interrupted 3-0 Vicryl sutures.  The skin edges were then closed with 5 simple interrupted 3-0 Ethilon sutures. Lesion was not sent to pathology. Patient tolerated procedure well with minimal blood loss. Recheck in one week for suture removal or sooner if worse

## 2016-08-12 ENCOUNTER — Ambulatory Visit: Payer: Self-pay | Admitting: Physician Assistant

## 2016-08-12 ENCOUNTER — Encounter: Payer: Self-pay | Admitting: Physician Assistant

## 2016-08-12 VITALS — BP 120/80 | HR 104 | Temp 98.4°F

## 2016-08-12 DIAGNOSIS — Z5189 Encounter for other specified aftercare: Secondary | ICD-10-CM

## 2016-08-12 NOTE — Progress Notes (Signed)
S: ? We can take out sutures, had a lipoma removed by her doctor and he said she could get the sutures out here, pt states they used vicryl and ethilon sutures, been in there since the 14th, area is a little itchy  O: vitals wnl, nad, skin with 3 sutures in place, 2 sutures have fallen out and the skin is slightly open at the end of the incision.  No weeping or drainage, no pus or redness, n/v intact  A: wound check  P: wait until next week to remove sutures , keep area as dry as possible

## 2016-08-17 ENCOUNTER — Encounter: Payer: Self-pay | Admitting: Physician Assistant

## 2016-08-17 ENCOUNTER — Ambulatory Visit: Payer: Self-pay | Admitting: Physician Assistant

## 2016-08-17 VITALS — BP 130/82 | HR 70 | Temp 98.1°F

## 2016-08-17 DIAGNOSIS — Z4802 Encounter for removal of sutures: Secondary | ICD-10-CM

## 2016-08-17 NOTE — Progress Notes (Signed)
S: pt here for suture removal.  Was seen here for a wound check last week and did not appear that the sutures should be removed at that time, no fever/chills  O: vitals wnl, nad, skin is closed, wound is well approximated, some redness at incision which is typical of suture reaction, no drainage noted, n/v intact, 3 sutures removed  A: suture removal  P: f/u prn

## 2016-08-23 MED FILL — SIMVASTATIN 10 MG TABLET: 10 | 90 days supply | Qty: 90 | Fill #2

## 2016-08-23 MED FILL — LEVOTHYROXINE 100 MCG TABLE: 100 | 90 days supply | Qty: 90 | Fill #3

## 2016-08-26 ENCOUNTER — Encounter: Payer: Self-pay | Admitting: Internal Medicine

## 2016-08-26 ENCOUNTER — Telehealth: Payer: Self-pay | Admitting: Physician Assistant

## 2016-08-26 MED ORDER — FLUCONAZOLE 150 MG PO TABS: 150.0000 mg | ORAL_TABLET | Freq: Once | ORAL | 0 refills | Status: AC

## 2016-08-26 MED FILL — FLUCONAZOLE 150 MG TABLET: 150 | 1 days supply | Qty: 1 | Fill #0

## 2016-08-26 NOTE — Telephone Encounter (Signed)
Patient is calling to see if she can get  Diflucan called in, she was in the er with an alleric rxn and was given prednisone for this now has yeast infection     [facility]

## 2016-08-26 NOTE — Telephone Encounter (Signed)
Prescription sent to pharmacy for Diflucan. Advised that if S/Sx do not resolve after dosage, OV will be required.  

## 2016-08-30 ENCOUNTER — Other Ambulatory Visit: Payer: Self-pay | Admitting: Physician Assistant

## 2016-08-30 MED ORDER — ZOLPIDEM TARTRATE 10 MG PO TABS: 10.0000 mg | ORAL_TABLET | Freq: Every day | ORAL | 2 refills | Status: DC | PRN

## 2016-08-30 MED FILL — ZOLPIDEM TARTRATE 10 MG TAB: 10 | 30 days supply | Qty: 30 | Fill #0

## 2016-08-30 NOTE — Telephone Encounter (Signed)
Approved. #[id number]. 

## 2016-08-30 NOTE — Telephone Encounter (Signed)
Rx called in to pharmacy. 

## 2016-08-30 NOTE — Telephone Encounter (Signed)
Last OV 05/04/2016 Last refill 02/29/2016 Ok to refill

## 2016-08-30 NOTE — Telephone Encounter (Signed)
Pt needs refill on ambien sent to [facility].

## 2016-08-31 ENCOUNTER — Ambulatory Visit (INDEPENDENT_AMBULATORY_CARE_PROVIDER_SITE_OTHER): Payer: 59 | Admitting: Physician Assistant

## 2016-08-31 VITALS — BP 118/70 | HR 98 | Temp 98.0°F | Wt 168.0 lb

## 2016-08-31 DIAGNOSIS — N390 Urinary tract infection, site not specified: Secondary | ICD-10-CM

## 2016-08-31 DIAGNOSIS — E039 Hypothyroidism, unspecified: Secondary | ICD-10-CM | POA: Diagnosis not present

## 2016-08-31 DIAGNOSIS — I10 Essential (primary) hypertension: Secondary | ICD-10-CM

## 2016-08-31 DIAGNOSIS — E785 Hyperlipidemia, unspecified: Secondary | ICD-10-CM | POA: Diagnosis not present

## 2016-08-31 LAB — URINALYSIS, ROUTINE W REFLEX MICROSCOPIC
BILIRUBIN URINE: NEGATIVE
Glucose, UA: NEGATIVE
KETONES UR: NEGATIVE
NITRITE: POSITIVE — AB
Protein, ur: NEGATIVE
Specific Gravity, Urine: 1.025 (ref 1.001–1.035)
pH: 5 (ref 5.0–8.0)

## 2016-08-31 LAB — URINALYSIS, MICROSCOPIC ONLY
Casts: NONE SEEN [LPF]
Crystals: NONE SEEN [HPF]
Squamous Epithelial / LPF: NONE SEEN [HPF] (ref <=5)
Yeast: NONE SEEN [HPF]

## 2016-08-31 MED ORDER — CIPROFLOXACIN HCL 500 MG PO TABS: 500.0000 mg | ORAL_TABLET | Freq: Two times a day (BID) | ORAL | 0 refills | Status: DC

## 2016-08-31 MED ORDER — FLUCONAZOLE 150 MG PO TABS: 150.0000 mg | ORAL_TABLET | Freq: Once | ORAL | 0 refills | Status: AC

## 2016-08-31 MED FILL — CIPROFLOXACIN HCL 500 MG TA: 500 | 7 days supply | Qty: 14 | Fill #0

## 2016-08-31 MED FILL — FLUCONAZOLE 150 MG TABLET: 150 | 10 days supply | Qty: 3 | Fill #0

## 2016-08-31 NOTE — Progress Notes (Signed)
Patient ID: Cathy Howell MRN: [id number], DOB: 1955-11-17, 61 y.o. Date of Encounter: @DATE @  Chief Complaint:  Chief Complaint  Patient presents with  . Urinary Tract Infection    HPI: 61 y.o. year old female  presents with above.   Also she wanted me to be aware of the following, in case she has recurrence of itchy rash.  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 Says her hours are constatnly changing--sometimes working at night, sometimes at day, etc. Also keeps getting moved to different locations. Recently was scheduled to work in [place] at [employer] on a Monday. That weekend developed itchy rash on her lower legs then spread to entire body. Says that Monday during surgery, she was itchy/scratching. Surgeons noticed. They examined her skin. Noted blister on her toe that she says was not secondaryt to shoes, etc. They Rxed Prednisone taper. Itching and rash resolved.   Says the prednisone caused yeast infection so had to call here and get diflucna called in.   Now has symptoms of UTI. Has been drinking cranbery juice, water, eating yougart.   Is very concerned that these meds are going to continue cycle of yeast infections, etc.   Also she is wanting to do any labs that are due while she is here "so we dont tell her she has to come back in when she askes for refills" ! She is taking thyroid med as direct. No adv effects.  She is taking BP med as directed. No lightheadedness. She is taking statin. No myalgias or right upper quadrant pain.   Past Medical History:  Diagnosis Date  . Acute bronchitis    dx 05-22-2014---  per pt resolved no productive cough as of 05-30-2014  . Arthritis   . Depression   . Diabetes mellitus without complication ([place])    borderline for years; resolvedd with diet and weight loss  . Generalized anxiety disorder   . GERD (gastroesophageal reflux disease)   . History of diabetes mellitus    BORDERLINE PRIOR TO GASTRIC BAND--  NO LONGER ISSUE    . Hyperlipidemia   . Hypertension   . Hypothyroidism, postsurgical   . Left rotator cuff tear   . Wears glasses      Home Meds: Outpatient Medications Prior to Visit  Medication Sig Dispense Refill  . clonazePAM (KLONOPIN) 1 MG tablet Take 1 tablet (1 mg total) by mouth daily. 90 tablet 2  . diclofenac sodium (VOLTAREN) 1 % GEL Apply 2 grams 4 times per day to affected area. 1 Tube 2  . docusate sodium (COLACE) 100 MG capsule Take 100 mg by mouth 2 (two) times daily as needed for mild constipation.     . hydrochlorothiazide (HYDRODIURIL) 25 MG tablet Take 1 tablet (25 mg total) by mouth daily. 90 tablet 1  . levothyroxine (SYNTHROID, LEVOTHROID) 100 MCG tablet TAKE 1 TABLET (100 MCG TOTAL) BY MOUTH DAILY 90 tablet 0  . lisinopril (PRINIVIL,ZESTRIL) 20 MG tablet TAKE 1 TABLET BY MOUTH DAILY. 90 tablet PRN  . Multiple Minerals-Vitamins (CALCIUM CITRATE PLUS/MAGNESIUM PO) Take 2 each by mouth 2 (two) times daily. 600mg /1500iu/120mg     . phentermine (ADIPEX-P) 37.5 MG tablet Take 37.5 mg by mouth as needed.  0  . simvastatin (ZOCOR) 10 MG tablet TAKE 1 TABLET BY MOUTH AT BEDTIME. 90 tablet PRN  . zolpidem (AMBIEN) 10 MG tablet Take 1 tablet (10 mg total) by mouth daily as needed. 30 tablet 2   No facility-administered medications prior  to visit.     Allergies:  Allergies  Allergen Reactions  . Codeine Itching  . Metoprolol Rash    Social History   Social History  . Marital status: Married    Spouse name: N/A  . Number of children: N/A  . Years of education: N/A   Occupational History  . Not on file.   Social History Main Topics  . Smoking status: Former Smoker    Packs/day: 1.00    Years: 15.00    Types: Cigarettes    Quit date: 02/22/1992  . Smokeless tobacco: Never Used  . Alcohol use No  . Drug use: No  . Sexual activity: Not on file   Other Topics Concern  . Not on file   Social History Narrative  . No narrative on file    Family History  Problem Relation  Age of Onset  . Cancer Maternal Grandfather        colon     Review of Systems:  See HPI for pertinent ROS. All other ROS negative.    Physical Exam: Blood pressure 118/70, pulse 98, temperature 98 F (36.7 C), weight 168 lb (76.2 kg), SpO2 98 %., Body mass index is 32.81 kg/m. General: WNWD WF. Appears in no acute distress. Neck: Supple. No thyromegaly. No lymphadenopathy. Lungs: Clear bilaterally to auscultation without wheezes, rales, or rhonchi. Breathing is unlabored. Heart: RRR with S1 S2. No murmurs, rubs, or gallops. Abdomen: Soft, non-tender, non-distended with normoactive bowel sounds. No hepatomegaly. No rebound/guarding. No obvious abdominal masses. Musculoskeletal:  Strength and tone normal for age.No costophrenic angle tenderness with percussion bilaterally. Extremities/Skin: Warm and dry.She has few light pink splotches scattered on lower legs. Remainder of rash has resolved. Neuro: Alert and oriented X 3. Moves all extremities spontaneously. Gait is normal. CNII-XII grossly in tact. Psych:  Responds to questions appropriately with a normal affect.   Results for orders placed or performed in visit on 08/31/16  Urinalysis, Routine w reflex microscopic  Result Value Ref Range   Color, Urine YELLOW YELLOW   APPearance CLOUDY (A) CLEAR   Specific Gravity, Urine 1.025 1.001 - 1.035   pH 5.0 5.0 - 8.0   Glucose, UA NEGATIVE NEGATIVE   Bilirubin Urine NEGATIVE NEGATIVE   Ketones, ur NEGATIVE NEGATIVE   Hgb urine dipstick TRACE (A) NEGATIVE   Protein, ur NEGATIVE NEGATIVE   Nitrite POSITIVE (A) NEGATIVE   Leukocytes, UA 1+ (A) NEGATIVE  Urine Microscopic  Result Value Ref Range   WBC, UA >60 (A) <=5 WBC/HPF   RBC / HPF 0-2 <=2 RBC/HPF   Squamous Epithelial / LPF NONE SEEN <=5 HPF   Bacteria, UA MODERATE (A) NONE SEEN HPF   Crystals NONE SEEN NONE SEEN HPF   Casts NONE SEEN NONE SEEN LPF   Yeast NONE SEEN NONE SEEN HPF     ASSESSMENT AND PLAN:  61 y.o. year old  female with  1. Urinary tract infection without hematuria, site unspecified  - ciprofloxacin (CIPRO) 500 MG tablet; Take 1 tablet (500 mg total) by mouth 2 (two) times daily.  Dispense: 14 tablet; Refill: 0 - fluconazole (DIFLUCAN) 150 MG tablet; Take 1 tablet (150 mg total) by mouth once. Repeat when indicated.  Dispense: 3 tablet; Refill: 0 - Urinalysis, Routine w reflex microscopic  2. Essential hypertension BP at goal. COnt currnet meds. Check lab to monitor.  - COMPLETE METABOLIC PANEL WITH GFR  3. Hypothyroidism, unspecified type She is taking thyroid med as directed. Check lab to monitor. -  TSH  4. Hyperlipidemia, unspecified hyperlipidemia type She is taking statin. Not fasting today. Check LFTs. Schedule next OV when she can come fasting to recheck FLP at that time.  - COMPLETE METABOLIC PANEL WITH GFR  ROV 6 months, sooner if needed.  [address] [place], [place], [facility] 08/31/2016 7:01 PM

## 2016-09-01 LAB — COMPLETE METABOLIC PANEL WITH GFR
ALBUMIN: 4.3 g/dL (ref 3.6–5.1)
ALK PHOS: 74 U/L (ref 33–130)
ALT: 13 U/L (ref 6–29)
AST: 13 U/L (ref 10–35)
BILIRUBIN TOTAL: 0.3 mg/dL (ref 0.2–1.2)
BUN: 24 mg/dL (ref 7–25)
CALCIUM: 9.3 mg/dL (ref 8.6–10.4)
CO2: 23 mmol/L (ref 20–31)
CREATININE: 0.89 mg/dL (ref 0.50–0.99)
Chloride: 103 mmol/L (ref 98–110)
GFR, EST AFRICAN AMERICAN: 81 mL/min (ref >=60)
GFR, EST NON AFRICAN AMERICAN: 71 mL/min (ref >=60)
Glucose, Bld: 94 mg/dL (ref 70–99)
Potassium: 4.6 mmol/L (ref 3.5–5.3)
Sodium: 138 mmol/L (ref 135–146)
TOTAL PROTEIN: 6.9 g/dL (ref 6.1–8.1)

## 2016-09-01 LAB — TSH: TSH: 1.41 m[IU]/L

## 2016-09-20 ENCOUNTER — Other Ambulatory Visit: Payer: Self-pay | Admitting: Obstetrics and Gynecology

## 2016-09-20 DIAGNOSIS — Z1231 Encounter for screening mammogram for malignant neoplasm of breast: Secondary | ICD-10-CM

## 2016-09-30 ENCOUNTER — Other Ambulatory Visit: Payer: Self-pay | Admitting: Physician Assistant

## 2016-09-30 ENCOUNTER — Telehealth: Payer: Self-pay | Admitting: Physician Assistant

## 2016-09-30 NOTE — Telephone Encounter (Signed)
Lab results mailed as requested.

## 2016-09-30 NOTE — Telephone Encounter (Signed)
New Message   *STAT* If patient is at the pharmacy, call can be transferred to refill team.   1. Which medications need to be refilled? (please list name of each medication and dose if known)  zolpidem 10 mg tablet once daily 90 day refill hydrochlorothiazide 25 mg tablets once daily 90 days refill  2. Which pharmacy/location (including street and city if local pharmacy) is medication to be sent to? [facility]  3. Do they need a 30 day or 90 day supply?  90 day supply

## 2016-09-30 NOTE — Telephone Encounter (Signed)
Last OV 02/03/2016 Last refill 08/30/2016 2 refills Ok to refill ambien for 90 day supply?

## 2016-09-30 NOTE — Telephone Encounter (Signed)
New Message  Pt voiced she would like her lab results mailed to her.

## 2016-10-01 NOTE — Telephone Encounter (Signed)
Can Rx Ambien # [phone] [id number] 0

## 2016-10-03 MED ORDER — ZOLPIDEM TARTRATE 10 MG PO TABS: 10.0000 mg | ORAL_TABLET | Freq: Every day | ORAL | 0 refills | Status: DC | PRN

## 2016-10-03 MED ORDER — HYDROCHLOROTHIAZIDE 25 MG PO TABS: 25.0000 mg | ORAL_TABLET | Freq: Every day | ORAL | 1 refills | Status: DC

## 2016-10-03 NOTE — Telephone Encounter (Signed)
Ambien called into pharmacy

## 2016-10-07 ENCOUNTER — Telehealth: Payer: Self-pay | Admitting: Physician Assistant

## 2016-10-07 NOTE — Telephone Encounter (Signed)
New Message  Pt voiced she would like to go over labs with nurse and wanting to know if there's anything she needs to change.

## 2016-10-11 NOTE — Telephone Encounter (Signed)
Spoke with patient and went over lab results 

## 2016-10-25 MED FILL — LISINOPRIL 20 MG TABS: 20 | 90 days supply | Qty: 90 | Fill #4

## 2016-10-26 ENCOUNTER — Ambulatory Visit
Admission: RE | Admit: 2016-10-26 | Discharge: 2016-10-26 | Disposition: A | Discharge status: Home / Self Care | Payer: 59 | Source: Ambulatory Visit | Attending: Obstetrics and Gynecology | Admitting: Obstetrics and Gynecology

## 2016-10-26 DIAGNOSIS — Z1231 Encounter for screening mammogram for malignant neoplasm of breast: Secondary | ICD-10-CM | POA: Diagnosis not present

## 2016-10-27 DIAGNOSIS — M25511 Pain in right shoulder: Secondary | ICD-10-CM | POA: Diagnosis not present

## 2016-11-16 ENCOUNTER — Telehealth: Payer: Self-pay | Admitting: Family Medicine

## 2016-11-16 MED ORDER — FLUCONAZOLE 150 MG PO TABS: 150.0000 mg | ORAL_TABLET | Freq: Once | ORAL | 0 refills | Status: AC

## 2016-11-16 MED ORDER — CIPROFLOXACIN HCL 500 MG PO TABS: 500.0000 mg | ORAL_TABLET | Freq: Two times a day (BID) | ORAL | 0 refills | Status: DC

## 2016-11-16 NOTE — Telephone Encounter (Signed)
Pt called and states that she has another UTI and would like to know if you could call her in something as she works in the [employer] and she can not get away today and it is $45 to go to UC. She did the home test strip and it was positive purple.   Please call and will have to leave a message as she will not be able to answer the phone. [phone]

## 2016-11-16 NOTE — Telephone Encounter (Signed)
Cipro 500 mg 1 by mouth twice a day 5 days dispense #10+0 If symptoms worsen or do not resolve upon completion of this antibiotic then will need to come in for OV.

## 2016-11-16 NOTE — Telephone Encounter (Signed)
Patient is aware of  RX sent to pharmacy , Patient also complains of upper thigh and leg cramps that she experienced 9/25 tried mustard and got relief. Explained if symptoms continue then she will need to  schedule an appointment. Pt agrees

## 2016-11-17 MED FILL — CIPROFLOXACIN HCL 500 MG TA: 500 | 5 days supply | Qty: 10 | Fill #0

## 2016-11-17 MED FILL — FLUCONAZOLE 150 MG TABLET: 150 | 1 days supply | Qty: 1 | Fill #0

## 2016-12-12 ENCOUNTER — Other Ambulatory Visit: Payer: Self-pay | Admitting: Physician Assistant

## 2016-12-12 MED FILL — LEVOTHYROXINE 100 MCG TABLE: 100 | 90 days supply | Qty: 90 | Fill #0

## 2016-12-12 NOTE — Telephone Encounter (Signed)
Medication refilled per protocol. 

## 2016-12-13 ENCOUNTER — Other Ambulatory Visit: Payer: Self-pay

## 2016-12-14 ENCOUNTER — Other Ambulatory Visit: Payer: Self-pay | Admitting: Physician Assistant

## 2016-12-14 MED FILL — SIMVASTATIN 10 MG TABLET: 10 | 90 days supply | Qty: 90 | Fill #0

## 2016-12-15 ENCOUNTER — Encounter: Payer: Self-pay | Admitting: Physician Assistant

## 2016-12-15 ENCOUNTER — Ambulatory Visit (INDEPENDENT_AMBULATORY_CARE_PROVIDER_SITE_OTHER): Payer: 59 | Admitting: Physician Assistant

## 2016-12-15 VITALS — BP 136/84 | HR 91 | Temp 97.8°F | Resp 16 | Wt 164.6 lb

## 2016-12-15 DIAGNOSIS — M791 Myalgia, unspecified site: Secondary | ICD-10-CM | POA: Diagnosis not present

## 2016-12-15 DIAGNOSIS — M62838 Other muscle spasm: Secondary | ICD-10-CM | POA: Diagnosis not present

## 2016-12-15 MED ORDER — CYCLOBENZAPRINE HCL 10 MG PO TABS: 10.0000 mg | ORAL_TABLET | Freq: Three times a day (TID) | ORAL | 0 refills | Status: DC | PRN

## 2016-12-15 MED FILL — CYCLOBENZAPRINE 10 MG TAB: 10 | 10 days supply | Qty: 30 | Fill #0

## 2016-12-15 NOTE — Progress Notes (Signed)
Patient ID: ANJELINA DUNG MRN: [id number], DOB: 1955/06/26, 61 y.o. Date of Encounter: 12/15/2016, 2:30 PM    Chief Complaint:  Chief Complaint  Patient presents with  . cramps in legs     HPI: 61 y.o. year old female presents with above.   She states that she has been waking up at 1 AM or 2 AM with severe muscle cramps in her medial thighs bilaterally. She works in the [employer]. She discussed it with a doctor there and they recommended she use yellow mustard. States that she did this a couple of nights but then she felt like she was going to vomit says that she cannot eat mustard at 1 AM. Says that the spasms have continued to happen at night and causes severe pain. Points to medial aspect of both thighs as areas of spasms and muscle spasms.  States that she is having no other symptoms. During the day is feeling no myalgias or pain in her legs or elsewhere.  She has been doing no unusual exercise or activity. She has not been taking any supplements or new medicines/treatments.   Home Meds:   Outpatient Medications Prior to Visit  Medication Sig Dispense Refill  . clonazePAM (KLONOPIN) 1 MG tablet Take 1 tablet (1 mg total) by mouth daily. 90 tablet 2  . docusate sodium (COLACE) 100 MG capsule Take 100 mg by mouth 2 (two) times daily as needed for mild constipation.     . hydrochlorothiazide (HYDRODIURIL) 25 MG tablet Take 1 tablet (25 mg total) by mouth daily. 90 tablet 1  . levothyroxine (SYNTHROID, LEVOTHROID) 100 MCG tablet TAKE 1 TABLET BY MOUTH DAILY 90 tablet 1  . lisinopril (PRINIVIL,ZESTRIL) 20 MG tablet TAKE 1 TABLET BY MOUTH DAILY. 90 tablet PRN  . Multiple Minerals-Vitamins (CALCIUM CITRATE PLUS/MAGNESIUM PO) Take 2 each by mouth 2 (two) times daily. 600mg /1500iu/120mg     . simvastatin (ZOCOR) 10 MG tablet TAKE 1 TABLET BY MOUTH AT BEDTIME. 90 tablet PRN  . zolpidem (AMBIEN) 10 MG tablet Take 1 tablet (10 mg total) by mouth daily as needed. 90 tablet 0  . diclofenac  sodium (VOLTAREN) 1 % GEL Apply 2 grams 4 times per day to affected area. (Patient not taking: Reported on 12/15/2016) 1 Tube 2  . phentermine (ADIPEX-P) 37.5 MG tablet Take 37.5 mg by mouth as needed.  0  . ciprofloxacin (CIPRO) 500 MG tablet Take 1 tablet (500 mg total) by mouth 2 (two) times daily. 10 tablet 0   No facility-administered medications prior to visit.     Allergies:  Allergies  Allergen Reactions  . Codeine Itching  . Metoprolol Rash      Review of Systems: See HPI for pertinent ROS. All other ROS negative.    Physical Exam: Blood pressure 136/84, pulse 91, temperature 97.8 F (36.6 C), temperature source Oral, resp. rate 16, weight 74.7 kg (164 lb 9.6 oz), SpO2 98 %., Body mass index is 32.15 kg/m. General: WF.  Appears in no acute distress. Neck: Supple. No thyromegaly. No lymphadenopathy. Lungs: Clear bilaterally to auscultation without wheezes, rales, or rhonchi. Breathing is unlabored. Heart: Regular rhythm. No murmurs, rubs, or gallops. Msk:  Strength and tone normal for age. Extremities/Skin: Warm and dry. No edema. No rashes or suspicious lesions. Inspection of legs, thighs normal.  Neuro: Alert and oriented X 3. Moves all extremities spontaneously. Gait is normal. CNII-XII grossly in tact. Psych:  Responds to questions appropriately with a normal affect.     ASSESSMENT  AND PLAN:  61 y.o. year old female with  1. Muscle spasm Stop simvastatin. Take Flexeril at bedtime. She is to call me within 1 week and let me know if the spasms have resolved. - COMPLETE METABOLIC PANEL WITH GFR - CK - cyclobenzaprine (FLEXERIL) 10 MG tablet; Take 1 tablet (10 mg total) by mouth 3 (three) times daily as needed for muscle spasms.  Dispense: 30 tablet; Refill: 0  2. Myalgia  - COMPLETE METABOLIC PANEL WITH GFR - CK - cyclobenzaprine (FLEXERIL) 10 MG tablet; Take 1 tablet (10 mg total) by mouth 3 (three) times daily as needed for muscle spasms.  Dispense: 30  tablet; Refill: 0   Signed, [address] [place], [place], [facility] 12/15/2016 2:30 PM

## 2016-12-16 LAB — COMPLETE METABOLIC PANEL WITH GFR
AG Ratio: 1.7 (calc) (ref 1.0–2.5)
ALBUMIN MSPROF: 4.3 g/dL (ref 3.6–5.1)
ALT: 15 U/L (ref 6–29)
AST: 18 U/L (ref 10–35)
Alkaline phosphatase (APISO): 65 U/L (ref 33–130)
BILIRUBIN TOTAL: 0.4 mg/dL (ref 0.2–1.2)
BUN: 13 mg/dL (ref 7–25)
CHLORIDE: 104 mmol/L (ref 98–110)
CO2: 28 mmol/L (ref 20–32)
CREATININE: 0.91 mg/dL (ref 0.50–0.99)
Calcium: 9.2 mg/dL (ref 8.6–10.4)
GFR, EST AFRICAN AMERICAN: 79 mL/min/{1.73_m2} (ref >=60)
GFR, Est Non African American: 69 mL/min/{1.73_m2} (ref >=60)
GLOBULIN: 2.6 g/dL (ref 1.9–3.7)
GLUCOSE: 83 mg/dL (ref 65–99)
Potassium: 4.4 mmol/L (ref 3.5–5.3)
SODIUM: 141 mmol/L (ref 135–146)
TOTAL PROTEIN: 6.9 g/dL (ref 6.1–8.1)

## 2016-12-16 LAB — CK: Total CK: 195 U/L — ABNORMAL HIGH (ref 29–143)

## 2016-12-23 ENCOUNTER — Telehealth: Payer: Self-pay

## 2016-12-23 NOTE — Telephone Encounter (Signed)
FYI Patient called to report she has not had any cramps or muscle spasms since coming off simvastatin. Patient states she take 1 flexeril in the evening

## 2016-12-26 NOTE — Telephone Encounter (Signed)
Spoke with Patient she is  aware of provider recommendations

## 2016-12-26 NOTE — Telephone Encounter (Signed)
Have her stop taking the flexeril.  Stay off the simvastatin.  Call in one week to let me know if cramps/ spasms remain resolved --- if so, then I will try a different cholesterol medicine.

## 2017-01-02 ENCOUNTER — Other Ambulatory Visit: Payer: Self-pay | Admitting: Physician Assistant

## 2017-01-02 ENCOUNTER — Telehealth: Payer: Self-pay

## 2017-01-02 DIAGNOSIS — E785 Hyperlipidemia, unspecified: Secondary | ICD-10-CM

## 2017-01-02 DIAGNOSIS — Z6832 Body mass index (BMI) 32.0-32.9, adult: Secondary | ICD-10-CM | POA: Diagnosis not present

## 2017-01-02 DIAGNOSIS — Z01419 Encounter for gynecological examination (general) (routine) without abnormal findings: Secondary | ICD-10-CM | POA: Diagnosis not present

## 2017-01-02 MED FILL — ZOLPIDEM TARTRATE 10 MG TAB: 10 | 90 days supply | Qty: 90 | Fill #0

## 2017-01-02 MED FILL — HYDROCHLOROTHIAZIDE 25 MG T: 25 | 90 days supply | Qty: 90 | Fill #0

## 2017-01-02 NOTE — Telephone Encounter (Signed)
Last OV 10/25 Last refill 8/13 Ok to refill?

## 2017-01-02 NOTE — Telephone Encounter (Signed)
ambien called in

## 2017-01-02 NOTE — Telephone Encounter (Signed)
Approved #[id number] 

## 2017-01-02 NOTE — Telephone Encounter (Signed)
Patient called lvm stating she has not had any cramps or spasms since stopping the flexeril

## 2017-01-04 ENCOUNTER — Other Ambulatory Visit: Payer: Self-pay

## 2017-01-04 MED ORDER — CLONAZEPAM 1 MG PO TABS: 1.0000 mg | ORAL_TABLET | Freq: Every day | ORAL | 2 refills | Status: DC

## 2017-01-04 NOTE — Telephone Encounter (Signed)
Last OV 10/25 Last refill 5/16 Ok to refill

## 2017-01-04 NOTE — Telephone Encounter (Signed)
Tell patient that it was most likely the simvastatin that was causing her muscle cramps. We will try pravastatin which goes through a different enzyme in the body and usually causes less side effects. Pravastatin 10 mg 1 p.o. daily #30+1 refill. FLP/LFTs in 6 weeks. If muscle cramps reoccur, then call me immediately. Remove simvastatin from med list.  Add simvastatin to allergy list and document muscle cramps.

## 2017-01-04 NOTE — Telephone Encounter (Signed)
rx called into pharmacy

## 2017-01-04 NOTE — Telephone Encounter (Signed)
Approved. # 90 + 2. 

## 2017-01-05 MED ORDER — PRAVASTATIN SODIUM 10 MG PO TABS: 10.0000 mg | ORAL_TABLET | Freq: Every day | ORAL | 1 refills | Status: DC

## 2017-01-05 MED FILL — clonazePAM 1 MG TABS: 1 | 90 days supply | Qty: 90 | Fill #0

## 2017-01-05 MED FILL — PRAVASTATIN NA 10 MG TAB: 10 | 30 days supply | Qty: 30 | Fill #0

## 2017-01-05 NOTE — Addendum Note (Signed)
Addended by: Vonna Kotyk A on: 01/05/2017 03:07 PM   Modules accepted: Orders

## 2017-01-05 NOTE — Telephone Encounter (Signed)
lvm as well as sent info through My Chart

## 2017-01-11 ENCOUNTER — Encounter (HOSPITAL_COMMUNITY): Payer: Self-pay

## 2017-02-01 ENCOUNTER — Ambulatory Visit (INDEPENDENT_AMBULATORY_CARE_PROVIDER_SITE_OTHER): Payer: 59

## 2017-02-01 ENCOUNTER — Ambulatory Visit: Payer: 59 | Admitting: Podiatry

## 2017-02-01 ENCOUNTER — Encounter: Payer: Self-pay | Admitting: Podiatry

## 2017-02-01 VITALS — BP 118/75 | HR 91 | Resp 18

## 2017-02-01 DIAGNOSIS — M205X9 Other deformities of toe(s) (acquired), unspecified foot: Secondary | ICD-10-CM | POA: Diagnosis not present

## 2017-02-01 DIAGNOSIS — L84 Corns and callosities: Secondary | ICD-10-CM | POA: Diagnosis not present

## 2017-02-01 MED FILL — AMOXICILLIN 500 MG CAPSULE: 500 | 7 days supply | Qty: 21 | Fill #0

## 2017-02-01 MED FILL — FLUCONAZOLE 150 MG TABLET: 150 | 1 days supply | Qty: 1 | Fill #0

## 2017-02-01 NOTE — Progress Notes (Signed)
Subjective:    Patient ID: Cathy Howell, female    DOB: 12/21/1955, 61 y.o.   MRN: [id number]  HPI  61 year old female presents the office today for concerns of a corn of the left fourth toe which is been ongoing for several months and the area is painful with pressure in shoes.  She is tried over-the-counter corn remover for any significant improvement.  She says the area is painful when she works.  She tries toward open that she has been colder weather she is wearing clothes and shoes which is causing pressure and pain to the area.  Denies any drainage or pus and denies any swelling or redness.  She has no other concerns today.  Review of Systems  All other systems reviewed and are negative.  Past Medical History:  Diagnosis Date  . Acute bronchitis    dx 05-22-2014---  per pt resolved no productive cough as of 05-30-2014  . Arthritis   . Depression   . Diabetes mellitus without complication ([place])    borderline for years; resolvedd with diet and weight loss  . Generalized anxiety disorder   . GERD (gastroesophageal reflux disease)   . History of diabetes mellitus    BORDERLINE PRIOR TO GASTRIC BAND--  NO LONGER ISSUE  . Hyperlipidemia   . Hypertension   . Hypothyroidism, postsurgical   . Left rotator cuff tear   . Wears glasses     Past Surgical History:  Procedure Laterality Date  . BROW LIFT Bilateral 07/03/2014   Procedure: BILATERAL UPPER LEFT BLEPHAROPLASTY;  Surgeon: Crissie Reese, MD;  Location: [place];  Service: Plastics;  Laterality: Bilateral;  . HIATAL HERNIA REPAIR  06/13/2011   Procedure: LAPAROSCOPIC REPAIR OF HIATAL HERNIA;  Surgeon: Edward Jolly, MD;  Location: [facility];  Service: General;;  . JOINT REPLACEMENT    . LAPAROSCOPIC GASTRIC BANDING  06/13/2011   Procedure: LAPAROSCOPIC GASTRIC BANDING;  Surgeon: Edward Jolly, MD;  Location: [facility];  Service: General;  Laterality: N/A;  . LEFT SHOULDER ARTHROSCOPY/  SAD/  ACROMIOPLASTY/  BURSECTOMY/   CA LIGAMENT RELEASE/  DCR/  ROTATOR CUFF REPAIR  09-21-2006  . MASS EXCISION Left 07/03/2014   Procedure: EXCISION LESION ON UPPER LEFT EYE LID;  Surgeon: Crissie Reese, MD;  Location: [facility];  Service: Plastics;  Laterality: Left;  . SHOULDER ARTHROSCOPY WITH ROTATOR CUFF REPAIR Left 06/03/2014   Procedure: LEFT SHOULDER ARTHROSCOPY WITH ROTATOR CUFF REPAIR;  Surgeon: Sydnee Cabal, MD;  Location: [facility];  Service: Orthopedics;  Laterality: Left;  . THYROIDECTOMY Bilateral 2007   benign mass  . TONSILLECTOMY  1960's  . TOTAL HIP ARTHROPLASTY Bilateral right 08-21-2006/   left  2005  . TUBAL LIGATION  1996     Current Outpatient Medications:  .  clonazePAM (KLONOPIN) 1 MG tablet, Take 1 tablet (1 mg total) daily by mouth., Disp: 90 tablet, Rfl: 2 .  cyclobenzaprine (FLEXERIL) 10 MG tablet, Take 1 tablet (10 mg total) by mouth 3 (three) times daily as needed for muscle spasms., Disp: 30 tablet, Rfl: 0 .  diclofenac sodium (VOLTAREN) 1 % GEL, Apply 2 grams 4 times per day to affected area. (Patient not taking: Reported on 12/15/2016), Disp: 1 Tube, Rfl: 2 .  docusate sodium (COLACE) 100 MG capsule, Take 100 mg by mouth 2 (two) times daily as needed for mild constipation. , Disp: , Rfl:  .  hydrochlorothiazide (HYDRODIURIL) 25 MG tablet, Take 1 tablet (25 mg total) by mouth daily.,  Disp: 90 tablet, Rfl: 1 .  levothyroxine (SYNTHROID, LEVOTHROID) 100 MCG tablet, TAKE 1 TABLET BY MOUTH DAILY, Disp: 90 tablet, Rfl: 1 .  lisinopril (PRINIVIL,ZESTRIL) 20 MG tablet, TAKE 1 TABLET BY MOUTH DAILY., Disp: 90 tablet, Rfl: PRN .  Multiple Minerals-Vitamins (CALCIUM CITRATE PLUS/MAGNESIUM PO), Take 2 each by mouth 2 (two) times daily. 600mg /1500iu/120mg , Disp: , Rfl:  .  phentermine (ADIPEX-P) 37.5 MG tablet, Take 37.5 mg by mouth as needed., Disp: , Rfl: 0 .  pravastatin (PRAVACHOL) 10 MG tablet, Take 1 tablet (10 mg total) daily by mouth., Disp: 30 tablet, Rfl: 1 .  zolpidem (AMBIEN) 10  MG tablet, TAKE 1 TABLET BY MOUTH ONCE DAILY EVERY EVENING AS NEEDED, Disp: 90 tablet, Rfl: 1  Allergies  Allergen Reactions  . Codeine Itching  . Simvastatin Other (See Comments)    Muscle cramps  . Metoprolol Rash    Social History   Socioeconomic History  . Marital status: Married    Spouse name: Not on file  . Number of children: Not on file  . Years of education: Not on file  . Highest education level: Not on file  Social Needs  . Financial resource strain: Not on file  . Food insecurity - worry: Not on file  . Food insecurity - inability: Not on file  . Transportation needs - medical: Not on file  . Transportation needs - non-medical: Not on file  Occupational History  . Not on file  Tobacco Use  . Smoking status: Former Smoker    Packs/day: 1.00    Years: 15.00    Pack years: 15.00    Types: Cigarettes    Last attempt to quit: 02/22/1992    Years since quitting: 24.9  . Smokeless tobacco: Never Used  Substance and Sexual Activity  . Alcohol use: No  . Drug use: No  . Sexual activity: Not on file  Other Topics Concern  . Not on file  Social History Narrative  . Not on file         Objective:   Physical Exam  General: AAO x3, NAD  Dermatological: Minimal hyperkeratotic tissue is present to the lateral left fourth toe.  No underlying ulceration drainage or any signs of infection are present. There is no other open lesions or pre-ulcerative lesion identified today.  Vascular: Dorsalis Pedis artery and Posterior Tibial artery pedal pulses are 2/4 bilateral with immedate capillary fill time. . There is no pain with calf compression, swelling, warmth, erythema.   Neruologic: Grossly intact via light touch bilateral.  Protective threshold with Semmes Wienstein monofilament intact to all pedal sites bilateral.   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  Muscular strength 5/5 in all groups tested bilateral.  Gait:  Unassisted, Nonantalgic.     Assessment & Plan:  61 year old female with hyperkeratotic lesion due to digital deformity -Treatment options discussed including all alternatives, risks, and complications -Etiology of symptoms were discussed -X-rays were obtained and reviewed with the patient.  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  No evidence of acute fracture. -I sharply debrided with a scalpel to the small hyperkeratotic tissue.  There is no underlying signs of infection.  We discussed both conservative as well as surgical treatment options.  We discussed the change in shoes and also dispensed offloading pads to help take pressure off the area. -Follow-up as needed.  Trula Slade DPM

## 2017-02-03 DIAGNOSIS — H524 Presbyopia: Secondary | ICD-10-CM | POA: Diagnosis not present

## 2017-02-03 DIAGNOSIS — H5213 Myopia, bilateral: Secondary | ICD-10-CM | POA: Diagnosis not present

## 2017-02-06 ENCOUNTER — Encounter: Payer: Self-pay | Admitting: Physician Assistant

## 2017-02-06 ENCOUNTER — Ambulatory Visit (INDEPENDENT_AMBULATORY_CARE_PROVIDER_SITE_OTHER): Payer: 59 | Admitting: Physician Assistant

## 2017-02-06 ENCOUNTER — Other Ambulatory Visit: Payer: Self-pay

## 2017-02-06 VITALS — BP 126/82 | HR 105 | Temp 98.1°F | Resp 16 | Ht 59.5 in | Wt 169.0 lb

## 2017-02-06 DIAGNOSIS — G4700 Insomnia, unspecified: Secondary | ICD-10-CM

## 2017-02-06 DIAGNOSIS — E039 Hypothyroidism, unspecified: Secondary | ICD-10-CM | POA: Diagnosis not present

## 2017-02-06 DIAGNOSIS — E785 Hyperlipidemia, unspecified: Secondary | ICD-10-CM

## 2017-02-06 DIAGNOSIS — Z1159 Encounter for screening for other viral diseases: Secondary | ICD-10-CM | POA: Diagnosis not present

## 2017-02-06 DIAGNOSIS — Z0000 Encounter for general adult medical examination without abnormal findings: Secondary | ICD-10-CM

## 2017-02-06 DIAGNOSIS — Z114 Encounter for screening for human immunodeficiency virus [HIV]: Secondary | ICD-10-CM | POA: Diagnosis not present

## 2017-02-06 DIAGNOSIS — I10 Essential (primary) hypertension: Secondary | ICD-10-CM

## 2017-02-06 NOTE — Progress Notes (Signed)
Patient ID: Cathy Howell MRN: [id number], DOB: 1955-10-06, 61 y.o. Date of Encounter: 02/06/2017,   Chief Complaint: Physical (CPE)  HPI: 61 y/o female  here for CPE.   She reports that she took the pravastatin for about 1 week but it caused myalgias--symptoms just the same that the prior statin caused.  Caused the same cramping pain in her legs.  Discontinued the pravastatin and the cramps and muscle pain resolved.  She is taking blood pressure medications as directed. No lightheadedness or other adverse effects.  She is taking cholesterol medication as directed. No myalgias or other adverse effects.  She is taking her thyroid medicine.   She tells me that she had a new grandbaby born in November.  She just recently went and visited [place] for about 2-1/2 weeks.   Review of Systems: Consitutional: No fever, chills, fatigue, night sweats, lymphadenopathy. No significant/unexplained weight changes. Eyes: No visual changes, eye redness, or discharge. ENT/Mouth: No ear pain, sore throat, nasal drainage, or sinus pain. Cardiovascular: No chest pressure,heaviness, tightness or squeezing, even with exertion. No increased shortness of breath or dyspnea on exertion.No palpitations, edema, orthopnea, PND. Respiratory: No cough, hemoptysis, SOB, or wheezing. Gastrointestinal: No anorexia, dysphagia, reflux, pain, nausea, vomiting, hematemesis, diarrhea, constipation, BRBPR, or melena. Breast: No mass, nodules, bulging, or retraction. No skin changes or inflammation. No nipple discharge. No lymphadenopathy. Genitourinary: No dysuria, hematuria, incontinence, vaginal discharge, pruritis, burning, abnormal bleeding, or pain. Musculoskeletal: See HPI. Skin: No rash, pruritis, or concerning lesions. Neurological: No headache, dizziness, syncope, seizures, tremors, memory loss, coordination problems, or paresthesias. Psychological: No anxiety, depression, hallucinations,  SI/HI. Endocrine: No polydipsia, polyphagia, polyuria, or known diabetes.No increased fatigue. No palpitations/rapid heart rate. No significant/unexplained weight change. All other systems were reviewed and are otherwise negative.  Past Medical History:  Diagnosis Date  . Acute bronchitis    dx 05-22-2014---  per pt resolved no productive cough as of 05-30-2014  . Arthritis   . Depression   . Diabetes mellitus without complication ([place])    borderline for years; resolvedd with diet and weight loss  . Generalized anxiety disorder   . GERD (gastroesophageal reflux disease)   . History of diabetes mellitus    BORDERLINE PRIOR TO GASTRIC BAND--  NO LONGER ISSUE  . Hyperlipidemia   . Hypertension   . Hypothyroidism, postsurgical   . Left rotator cuff tear   . Wears glasses      Past Surgical History:  Procedure Laterality Date  . BROW LIFT Bilateral 07/03/2014   Procedure: BILATERAL UPPER LEFT BLEPHAROPLASTY;  Surgeon: Crissie Reese, MD;  Location: [facility];  Service: Plastics;  Laterality: Bilateral;  . HIATAL HERNIA REPAIR  06/13/2011   Procedure: LAPAROSCOPIC REPAIR OF HIATAL HERNIA;  Surgeon: Edward Jolly, MD;  Location: [facility];  Service: General;;  . JOINT REPLACEMENT    . LAPAROSCOPIC GASTRIC BANDING  06/13/2011   Procedure: LAPAROSCOPIC GASTRIC BANDING;  Surgeon: Edward Jolly, MD;  Location: [facility];  Service: General;  Laterality: N/A;  . LEFT SHOULDER ARTHROSCOPY/  SAD/  ACROMIOPLASTY/  BURSECTOMY/  CA LIGAMENT RELEASE/  DCR/  ROTATOR CUFF REPAIR  09-21-2006  . MASS EXCISION Left 07/03/2014   Procedure: EXCISION LESION ON UPPER LEFT EYE LID;  Surgeon: Crissie Reese, MD;  Location: [facility];  Service: Plastics;  Laterality: Left;  . SHOULDER ARTHROSCOPY WITH ROTATOR CUFF REPAIR Left 06/03/2014   Procedure: LEFT SHOULDER ARTHROSCOPY WITH ROTATOR CUFF REPAIR;  Surgeon: Sydnee Cabal, MD;  Location: [facility]  ;  Service: Orthopedics;  Laterality: Left;  .  THYROIDECTOMY Bilateral 2007   benign mass  . TONSILLECTOMY  1960's  . TOTAL HIP ARTHROPLASTY Bilateral right 08-21-2006/   left  2005  . TUBAL LIGATION  1996    Home Meds:  Outpatient Medications Prior to Visit  Medication Sig Dispense Refill  . clonazePAM (KLONOPIN) 1 MG tablet Take 1 tablet (1 mg total) daily by mouth. 90 tablet 2  . cyclobenzaprine (FLEXERIL) 10 MG tablet Take 1 tablet (10 mg total) by mouth 3 (three) times daily as needed for muscle spasms. 30 tablet 0  . diclofenac sodium (VOLTAREN) 1 % GEL Apply 2 grams 4 times per day to affected area. 1 Tube 2  . docusate sodium (COLACE) 100 MG capsule Take 100 mg by mouth 2 (two) times daily as needed for mild constipation.     . hydrochlorothiazide (HYDRODIURIL) 25 MG tablet Take 1 tablet (25 mg total) by mouth daily. 90 tablet 1  . levothyroxine (SYNTHROID, LEVOTHROID) 100 MCG tablet TAKE 1 TABLET BY MOUTH DAILY 90 tablet 1  . lisinopril (PRINIVIL,ZESTRIL) 20 MG tablet TAKE 1 TABLET BY MOUTH DAILY. 90 tablet PRN  . Multiple Minerals-Vitamins (CALCIUM CITRATE PLUS/MAGNESIUM PO) Take 2 each by mouth 2 (two) times daily. 600mg /1500iu/120mg     . phentermine (ADIPEX-P) 37.5 MG tablet Take 37.5 mg by mouth as needed.  0  . zolpidem (AMBIEN) 10 MG tablet TAKE 1 TABLET BY MOUTH ONCE DAILY EVERY EVENING AS NEEDED 90 tablet 1  . pravastatin (PRAVACHOL) 10 MG tablet Take 1 tablet (10 mg total) daily by mouth. (Patient not taking: Reported on 02/06/2017) 30 tablet 1   No facility-administered medications prior to visit.     Allergies:  Allergies  Allergen Reactions  . Codeine Itching  . Pravastatin Other (See Comments)    muscle cramps  . Simvastatin Other (See Comments)    Muscle cramps  . Metoprolol Rash    Social History   Socioeconomic History  . Marital status: Married    Spouse name: Not on file  . Number of children: Not on file  . Years of education: Not on file  . Highest education level: Not on file  Social Needs   . Financial resource strain: Not on file  . Food insecurity - worry: Not on file  . Food insecurity - inability: Not on file  . Transportation needs - medical: Not on file  . Transportation needs - non-medical: Not on file  Occupational History  . Not on file  Tobacco Use  . Smoking status: Former Smoker    Packs/day: 1.00    Years: 15.00    Pack years: 15.00    Types: Cigarettes    Last attempt to quit: 02/22/1992    Years since quitting: 24.9  . Smokeless tobacco: Never Used  Substance and Sexual Activity  . Alcohol use: No  . Drug use: No  . Sexual activity: Not on file  Other Topics Concern  . Not on file  Social History Narrative  . Not on file    Family History  Problem Relation Age of Onset  . Cancer Maternal Grandfather        colon    Physical Exam: Blood pressure 126/82, pulse (!) 105, temperature 98.1 F (36.7 C), temperature source Oral, resp. rate 16, height 4' 11.5" (1.511 m), weight 76.7 kg (169 lb), SpO2 98 %., Body mass index is 33.56 kg/m.  General: Obese WF. Appears in no acute distress.  HEENT: Normocephalic, atraumatic. Conjunctiva pink, sclera non-icteric. Pupils 2 mm constricting to 1 mm, round, regular, and equally reactive to light and accomodation. EOMI. Internal auditory canal clear. TMs with good cone of light and without pathology. Nasal mucosa pink. Nares are without discharge. No sinus tenderness. Oral mucosa pink Neck: Supple. Trachea midline. No thyromegaly. Full ROM. No lymphadenopathy.No Carotid Bruits. Lungs: Clear to auscultation bilaterally without wheezes, rales, or rhonchi. Breathing is of normal effort and unlabored. Cardiovascular: RRR with S1 S2. No murmurs, rubs, or gallops. Distal pulses 2+ symmetrically. No carotid or abdominal bruits. Breast: Per Gyn Abdomen: Soft, non-tender, non-distended with normoactive bowel sounds. No hepatosplenomegaly or masses. No rebound/guarding. No CVA tenderness. No hernias.  Genitourinary: Per  Gyn Musculoskeletal: Full range of motion and 5/5 strength throughout.  Skin: Warm and moist without erythema, ecchymosis, wounds, or rash. Neuro: A+Ox3. CN II-XII grossly intact. Moves all extremities spontaneously. Full sensation throughout. Normal gait. Psych:  Responds to questions appropriately with a normal affect.   Assessment/Plan:  61 y.o. y/o female here for CPE  1. Encounter for preventive health examination  A. Screening Labs: She is fasting. Check labs now.  Recheck cholesterol now off of statin and then decide whether to prescribe Zetia.  Also she is asking about red yeast rice.  B. Pap: She sees Dr. Helane Rima at [facility]. Has had Pap smear, Pelvic Exam there.  C. Screening Mammogram: She has mammogram performed at Dr. Eulogio Ditch. This is up to date.  D. DEXA/BMD:  She had DEXA scan performed at Dr. Gracy Racer at [facility]. They are managing this.  E. Colorectal Cancer Screening: She reports that Dr. Watt Climes did her colonoscopy. She says that it showed 2 polyps and she was told to repeat 5 years. She thinks that next colonoscopy is due this upcoming August. She is aware and will follow-up with Dr. Perley Jain office to verify this. In the computer I see something to do with a colonoscopy 10/15/12 and if that was the date it was done and she is post wait 5 years -- then this is not due until 10/15/2017. Pt aware and pt plans to follow-up regarding this.  F. Immunizations:  Influenza: She is at [facility]. She has had her flu vaccine for this season. Tetanus: [employer] employee. Patient states that she knows that they have her tetanus up-to-date but just doesn't know the exact date to get me. Pneumococcal: She has no indication to require this until age 11. Shingrix: Currently on company back order.  Will discuss at next visit.  2. Essential hypertension Blood Pressure is stable/controlled/at goal.  Continue current medicines.  Check lab to monitor.  3. Hypothyroidism, unspecified  type She is taking thyroid medication daily.  Check lab to monitor.  4. Morbid obesity ([place]) She is controlling her weight with diet and exercise.  5. Generalized anxiety disorder Stable/controlled. She uses clonazepam as needed.  6. Insomnia, unspecified type This is stable/controlled. She uses clonazepam as needed for this--- says that this medicine is working well for her and she is getting good sleep with this.  7. Hyperlipidemia, unspecified hyperlipidemia type Is fasting.  Will check labs now off of statin and decide future treatment.  Will consider Zetia depending on lab result.  Has had significant myalgias and muscle cramps with simvastatin and then again more recently with pravastatin.   Routine follow-up visit in 6 months or sooner if needed.   Signed, [address] [place], [place], [facility] 02/06/2017 10:54 AM

## 2017-02-07 LAB — COMPLETE METABOLIC PANEL WITH GFR
AG RATIO: 1.6 (calc) (ref 1.0–2.5)
ALBUMIN MSPROF: 4.5 g/dL (ref 3.6–5.1)
ALKALINE PHOSPHATASE (APISO): 74 U/L (ref 33–130)
ALT: 15 U/L (ref 6–29)
AST: 16 U/L (ref 10–35)
BUN: 18 mg/dL (ref 7–25)
CO2: 26 mmol/L (ref 20–32)
CREATININE: 0.91 mg/dL (ref 0.50–0.99)
Calcium: 9.5 mg/dL (ref 8.6–10.4)
Chloride: 101 mmol/L (ref 98–110)
GFR, Est African American: 79 mL/min/{1.73_m2} (ref >=60)
GFR, Est Non African American: 69 mL/min/{1.73_m2} (ref >=60)
GLOBULIN: 2.8 g/dL (ref 1.9–3.7)
Glucose, Bld: 102 mg/dL — ABNORMAL HIGH (ref 65–99)
POTASSIUM: 4.5 mmol/L (ref 3.5–5.3)
SODIUM: 138 mmol/L (ref 135–146)
Total Bilirubin: 0.3 mg/dL (ref 0.2–1.2)
Total Protein: 7.3 g/dL (ref 6.1–8.1)

## 2017-02-07 LAB — CBC WITH DIFFERENTIAL/PLATELET
BASOS ABS: 39 {cells}/uL (ref 0–200)
Basophils Relative: 0.6 %
EOS ABS: 117 {cells}/uL (ref 15–500)
Eosinophils Relative: 1.8 %
HEMATOCRIT: 41 % (ref 35.0–45.0)
HEMOGLOBIN: 13.7 g/dL (ref 11.7–15.5)
LYMPHS ABS: 1703 {cells}/uL (ref 850–3900)
MCH: 28.4 pg (ref 27.0–33.0)
MCHC: 33.4 g/dL (ref 32.0–36.0)
MCV: 84.9 fL (ref 80.0–100.0)
MPV: 10.4 fL (ref 7.5–12.5)
Monocytes Relative: 7 %
NEUTROS ABS: 4186 {cells}/uL (ref 1500–7800)
NEUTROS PCT: 64.4 %
Platelets: 295 10*3/uL (ref 140–400)
RBC: 4.83 10*6/uL (ref 3.80–5.10)
RDW: 13.1 % (ref 11.0–15.0)
Total Lymphocyte: 26.2 %
WBC: 6.5 10*3/uL (ref 3.8–10.8)
WBCMIX: 455 {cells}/uL (ref 200–950)

## 2017-02-07 LAB — LIPID PANEL
CHOL/HDL RATIO: 3.4 (calc) (ref <5.0)
CHOLESTEROL: 233 mg/dL — AB (ref <200)
HDL: 68 mg/dL (ref >50)
LDL Cholesterol (Calc): 146 mg/dL (calc) — ABNORMAL HIGH
NON-HDL CHOLESTEROL (CALC): 165 mg/dL — AB (ref <130)
Triglycerides: 82 mg/dL (ref <150)

## 2017-02-07 LAB — HEPATITIS C ANTIBODY
HEP C AB: NONREACTIVE
SIGNAL TO CUT-OFF: 0.02 (ref <1.00)

## 2017-02-07 LAB — TSH: TSH: 2.09 mIU/L (ref 0.40–4.50)

## 2017-02-07 LAB — HIV ANTIBODY (ROUTINE TESTING W REFLEX): HIV 1&2 Ab, 4th Generation: NONREACTIVE

## 2017-02-09 MED FILL — LISINOPRIL 20 MG TABLET: 20 | 90 days supply | Qty: 90 | Fill #0

## 2017-02-24 MED FILL — CHLORHEXIDINE 0.12% RINSE: 0.12 | 16 days supply | Qty: 473 | Fill #0

## 2017-02-24 MED FILL — FLUCONAZOLE 150 MG TABLET: 150 | 1 days supply | Qty: 1 | Fill #0

## 2017-02-24 MED FILL — OXYCODONE-ACETAMINOPHEN 5-3: 5-325 | 2 days supply | Qty: 14 | Fill #0

## 2017-02-24 MED FILL — AMOXICILLIN 500 MG CAPSULE: 500 | 7 days supply | Qty: 21 | Fill #0

## 2017-03-13 MED FILL — LEVOTHYROXINE 100 MCG TABLE: 100 | 90 days supply | Qty: 90 | Fill #1

## 2017-03-15 ENCOUNTER — Other Ambulatory Visit: Payer: Self-pay

## 2017-03-15 ENCOUNTER — Encounter: Payer: Self-pay | Admitting: Physician Assistant

## 2017-03-15 ENCOUNTER — Ambulatory Visit: Payer: 59 | Admitting: Physician Assistant

## 2017-03-15 VITALS — BP 100/62 | HR 111 | Temp 97.6°F | Resp 16 | Ht 59.5 in | Wt 164.6 lb

## 2017-03-15 DIAGNOSIS — A084 Viral intestinal infection, unspecified: Secondary | ICD-10-CM

## 2017-03-15 MED ORDER — PROMETHAZINE HCL 25 MG PO TABS: 25.0000 mg | ORAL_TABLET | Freq: Four times a day (QID) | ORAL | 0 refills | Status: DC | PRN

## 2017-03-15 NOTE — Progress Notes (Signed)
Patient ID: Cathy Howell MRN: [id number], DOB: 26-Feb-1955, 62 y.o. Date of Encounter: 03/15/2017, 4:47 PM    Chief Complaint:  Chief Complaint  Patient presents with  . Emesis  . Nausea  . Headache  . Diarrhea     HPI: 62 y.o. year old female presents with above.   States that she thinks she has a stomach bug.   States that during Sunday night into Monday morning she developed vomiting, nausea, lightheadedness, headache.   Monday morning she stayed out of work.   Used Pepto-Bismol, ice chips, ginger ale.   "Cannot keep anything down ".   Today had a little bit of chicken soup and vomited it up.   Today started having diarrhea as well.   Has been feeling weak, achy, chills.   Originally was scheduled to be working Monday through Friday but has not been able to work and knows that she will not be able to go into work as she is continuing with nausea vomiting and now diarrhea as well.   Has had no localized/focal areas of pain.   Rubs her hands up and down both sides of abdomen and says that all of that feels sore from vomiting and heaving.     Home Meds:   Outpatient Medications Prior to Visit  Medication Sig Dispense Refill  . clonazePAM (KLONOPIN) 1 MG tablet Take 1 tablet (1 mg total) daily by mouth. 90 tablet 2  . cyclobenzaprine (FLEXERIL) 10 MG tablet Take 1 tablet (10 mg total) by mouth 3 (three) times daily as needed for muscle spasms. 30 tablet 0  . diclofenac sodium (VOLTAREN) 1 % GEL Apply 2 grams 4 times per day to affected area. 1 Tube 2  . docusate sodium (COLACE) 100 MG capsule Take 100 mg by mouth 2 (two) times daily as needed for mild constipation.     . hydrochlorothiazide (HYDRODIURIL) 25 MG tablet Take 1 tablet (25 mg total) by mouth daily. 90 tablet 1  . levothyroxine (SYNTHROID, LEVOTHROID) 100 MCG tablet TAKE 1 TABLET BY MOUTH DAILY 90 tablet 1  . lisinopril (PRINIVIL,ZESTRIL) 20 MG tablet TAKE 1 TABLET BY MOUTH DAILY. 90 tablet PRN  . Multiple  Minerals-Vitamins (CALCIUM CITRATE PLUS/MAGNESIUM PO) Take 2 each by mouth 2 (two) times daily. 600mg /1500iu/120mg     . phentermine (ADIPEX-P) 37.5 MG tablet Take 37.5 mg by mouth as needed.  0  . zolpidem (AMBIEN) 10 MG tablet TAKE 1 TABLET BY MOUTH ONCE DAILY EVERY EVENING AS NEEDED 90 tablet 1  . pravastatin (PRAVACHOL) 10 MG tablet Take 1 tablet (10 mg total) daily by mouth. (Patient not taking: Reported on 03/15/2017) 30 tablet 1   No facility-administered medications prior to visit.     Allergies:  Allergies  Allergen Reactions  . Codeine Itching  . Pravastatin Other (See Comments)    muscle cramps  . Simvastatin Other (See Comments)    Muscle cramps  . Metoprolol Rash      Review of Systems: See HPI for pertinent ROS. All other ROS negative.    Physical Exam: Blood pressure 100/62, pulse (!) 111, temperature 97.6 F (36.4 C), temperature source Oral, resp. rate 16, height 4' 11.5" (1.511 m), weight 74.7 kg (164 lb 9.6 oz), SpO2 98 %., Body mass index is 32.69 kg/m. General:  WNWD WF. Appears in no acute distress. Neck: Supple. No thyromegaly. No lymphadenopathy. Lungs: Clear bilaterally to auscultation without wheezes, rales, or rhonchi. Breathing is unlabored. Heart: Regular rhythm. No murmurs, rubs, or  gallops. Abdomen: Soft,  non-distended with normoactive bowel sounds. No hepatomegaly. No rebound/guarding. No obvious abdominal masses.  Mild diffuse area of tenderness with palpation near epigastric region.  No other areas of localized/focal abdominal pain. Msk:  Strength and tone normal for age. Extremities/Skin: Warm and dry. Neuro: Alert and oriented X 3. Moves all extremities spontaneously. Gait is normal. CNII-XII grossly in tact. Psych:  Responds to questions appropriately with a normal affect.     ASSESSMENT AND PLAN:  62 y.o. year old female with  1. Viral gastroenteritis Will use Phenergan for nausea.  She will continue with clear liquid diet only.  Small  frequent sips of fluids to prevent dehydration.  Will stay with clear liquid diet only-- until vomiting and diarrhea completely resolve.  Then will gradually slowly advance to very bland diet with just plain crackers and then slowly gradually advance diet.   Note given for out of work Monday through Friday which would be 03/13/17 through 03/17/17 with plans to return to work 03/20/17. If develops any local/focal area of abdominal pain, follow-up immediately.   If develops fever follow-up immediately.   Discussed indications to go to ER.   I did not give IV fluids here as it is now 4:30 PM.  If vomiting and diarrhea do not become controlled in the next 24 hours then will need to get IV fluids. - promethazine (PHENERGAN) 25 MG tablet; Take 1 tablet (25 mg total) by mouth every 6 (six) hours as needed for nausea or vomiting.  Dispense: 30 tablet; Refill: 0   Signed, [address] [place], [place], [facility] 03/15/2017 4:47 PM

## 2017-03-20 MED FILL — PROMETHAZINE 25 MG TABLET: 25 | 8 days supply | Qty: 30 | Fill #0

## 2017-04-03 ENCOUNTER — Other Ambulatory Visit: Payer: Self-pay | Admitting: Physician Assistant

## 2017-04-03 MED FILL — clonazePAM 1 MG TABS: 1 | 90 days supply | Qty: 90 | Fill #1

## 2017-04-03 MED FILL — HYDROCHLOROTHIAZIDE 25 MG T: 25 | 90 days supply | Qty: 90 | Fill #0

## 2017-04-03 MED FILL — ZOLPIDEM TARTRATE 10 MG TAB: 10 | 90 days supply | Qty: 90 | Fill #1

## 2017-04-03 NOTE — Telephone Encounter (Signed)
Refill appropriate 

## 2017-04-12 MED FILL — CLINDAMYCIN HCL 150 MG CAPS: 150 | 6 days supply | Qty: 24 | Fill #0

## 2017-05-03 ENCOUNTER — Telehealth: Payer: Self-pay | Admitting: Physician Assistant

## 2017-05-03 NOTE — Telephone Encounter (Signed)
[facility]ANT[facility] [place] TO START SENDING PRESCRIPTIONS TO [facility]. [phone].

## 2017-05-03 NOTE — Telephone Encounter (Signed)
Noted  

## 2017-05-12 ENCOUNTER — Other Ambulatory Visit: Payer: Self-pay

## 2017-05-12 MED ORDER — LISINOPRIL 20 MG PO TABS: 20.0000 mg | ORAL_TABLET | Freq: Every day | ORAL | 99 refills | Status: DC

## 2017-06-08 ENCOUNTER — Other Ambulatory Visit: Payer: Self-pay

## 2017-06-08 DIAGNOSIS — M791 Myalgia, unspecified site: Secondary | ICD-10-CM

## 2017-06-08 DIAGNOSIS — A084 Viral intestinal infection, unspecified: Secondary | ICD-10-CM

## 2017-06-08 DIAGNOSIS — M62838 Other muscle spasm: Secondary | ICD-10-CM

## 2017-06-08 MED ORDER — ZOLPIDEM TARTRATE 10 MG PO TABS: ORAL_TABLET | ORAL | 1 refills | Status: AC

## 2017-06-08 MED ORDER — PROMETHAZINE HCL 25 MG PO TABS: 25.0000 mg | ORAL_TABLET | Freq: Four times a day (QID) | ORAL | 0 refills | Status: AC | PRN

## 2017-06-08 MED ORDER — DICLOFENAC SODIUM 1 % TD GEL: TRANSDERMAL | 2 refills | Status: AC

## 2017-06-08 MED ORDER — HYDROCHLOROTHIAZIDE 25 MG PO TABS: 25.0000 mg | ORAL_TABLET | Freq: Every day | ORAL | 0 refills | Status: AC

## 2017-06-08 MED ORDER — PRAVASTATIN SODIUM 10 MG PO TABS: 10.0000 mg | ORAL_TABLET | Freq: Every day | ORAL | 1 refills | Status: AC

## 2017-06-08 MED ORDER — CYCLOBENZAPRINE HCL 10 MG PO TABS: 10.0000 mg | ORAL_TABLET | Freq: Three times a day (TID) | ORAL | 0 refills | Status: AC | PRN

## 2017-06-08 MED ORDER — LISINOPRIL 20 MG PO TABS: 20.0000 mg | ORAL_TABLET | Freq: Every day | ORAL | 99 refills | Status: AC

## 2017-06-08 MED ORDER — PHENTERMINE HCL 37.5 MG PO TABS: 37.5000 mg | ORAL_TABLET | ORAL | 2 refills | Status: AC | PRN

## 2017-06-08 MED ORDER — LEVOTHYROXINE SODIUM 100 MCG PO TABS: 100.0000 ug | ORAL_TABLET | Freq: Every day | ORAL | 1 refills | Status: AC

## 2017-06-08 MED ORDER — CLONAZEPAM 1 MG PO TABS: 1.0000 mg | ORAL_TABLET | Freq: Every day | ORAL | 2 refills | Status: DC

## 2017-06-08 MED ORDER — DOCUSATE SODIUM 100 MG PO CAPS: 100.0000 mg | ORAL_CAPSULE | Freq: Two times a day (BID) | ORAL | 0 refills | Status: AC | PRN

## 2017-06-08 NOTE — Telephone Encounter (Signed)
Ok to refill Cathy Howell, clonazepam,phenergan Last OV 02/06/2017

## 2017-06-22 ENCOUNTER — Telehealth: Payer: Self-pay

## 2017-06-22 NOTE — Telephone Encounter (Signed)
Received fax from [facility] that patient Phentermine 37.5MG  tablet  requires a PA. Call was placed to patient to get correct insurance info no answer. Left voicemail to return my call so we can get updated insurance to process the PA

## 2017-06-23 NOTE — Telephone Encounter (Signed)
Received call from patient she will bring her insurance card by on 5/6 and states she no longer need the medication. I Will cancel the Prior Authorization for promethazine patient is aware

## 2017-06-23 NOTE — Telephone Encounter (Signed)
Patient would like to get a handwritten rx for shingrix vaccine to take to her pharmacy

## 2017-06-25 NOTE — Telephone Encounter (Signed)
Is a Rx for Shingrix needed? I am not aware that this has been required --- ??

## 2017-06-26 NOTE — Telephone Encounter (Signed)
In times past  When I spoke with the pharmacy they suggested it would be best if patient had a prescription. Also since the there seems to be a shortage the patient would have to check around to see who has it

## 2017-06-26 NOTE — Telephone Encounter (Signed)
Approved to Print Rx for series of 2 Shingrix

## 2017-06-28 MED ORDER — ZOSTER VAC RECOMB ADJUVANTED 50 MCG/0.5ML IM SUSR: 0.5000 mL | Freq: Once | INTRAMUSCULAR | 0 refills | Status: AC

## 2017-06-28 MED ORDER — ZOSTER VAC RECOMB ADJUVANTED 50 MCG/0.5ML IM SUSR: 0.5000 mL | Freq: Once | INTRAMUSCULAR | 0 refills | Status: DC

## 2017-06-28 NOTE — Telephone Encounter (Signed)
Call placed to patient she is aware that her prescriptions are available for pick up at the front desk

## 2017-09-29 ENCOUNTER — Other Ambulatory Visit: Payer: Self-pay | Admitting: Obstetrics and Gynecology

## 2017-09-29 DIAGNOSIS — Z1231 Encounter for screening mammogram for malignant neoplasm of breast: Secondary | ICD-10-CM

## 2017-10-08 IMAGING — RF DG FLUORO GUIDE NDL PLC/BX
1 series · 1 of 1 positions shown · non-contrast
Comparison: none

CLINICAL DATA: Recurrent shoulder pain. History of shoulder surgery
x2, most recently earlier this year.

EXAM:
LEFT SHOULDER INJECTION UNDER FLUOROSCOPY
TECHNIQUE: Time out procedure was performed. The procedure, risks (including
but not limited to bleeding, infection, organ damage ), benefits,
and alternatives were explained to the patient. Questions regarding
the procedure were encouraged and answered. The patient understands
and consents to the procedure.

[Series 1: run · 1 of 1 slices shown]
[im 1/1]
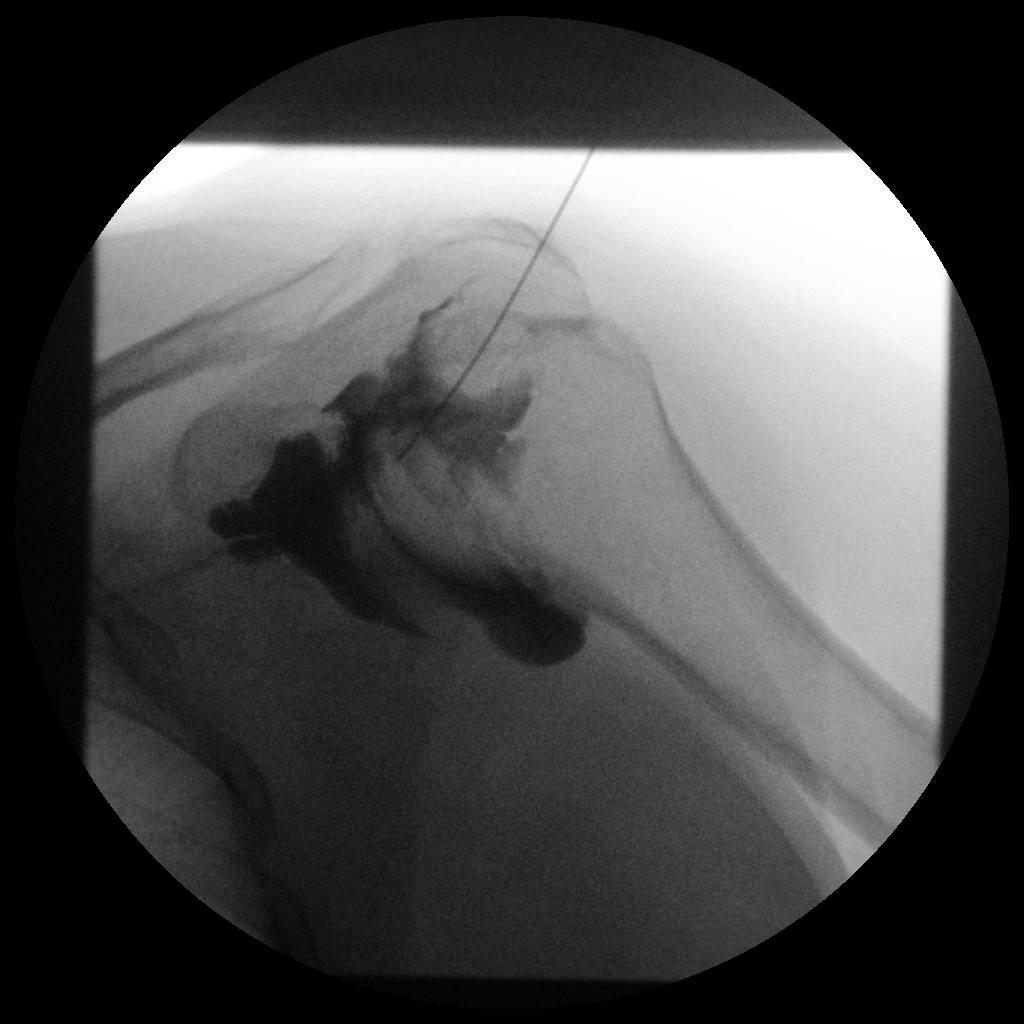

[1 of 1 positions shown; findings below may reference images not displayed]

An appropriate skin entrance site was determined. The site was
marked, prepped with Betadine, draped in the usual sterile fashion,
and infiltrated locally with buffered Lidocaine. 22 gauge spinal
needle was advanced to the superomedial margin of the humeral head
under intermittent fluoroscopy. There was some difficulty achieving
intra-articular position of the needle tip, attributed to body
habitus and possible adhesive capsulitis. A mixture of 0.05 ml
Multihance 10 ml of Omnipaque 300 was then used to opacify the left
shoulder capsule. No immediate complication.

FLUOROSCOPY TIME:  Radiation Exposure Index (as provided by the
fluoroscopic device):

If the device does not provide the exposure index:

Fluoroscopy Time (in minutes and seconds):  45 seconds

Number of Acquired Images:  1 spot fluoroscopic image.
FINDINGS: A spot fluoroscopic image after injection of the shoulder joint
demonstrates mild synovial irregularity. No contrast opacification
of the subacromial -subdeltoid bursa demonstrated.
IMPRESSION: Technically successful left shoulder injection for MRI.

## 2017-10-31 ENCOUNTER — Ambulatory Visit: Payer: Self-pay

## 2017-11-21 ENCOUNTER — Ambulatory Visit
Admission: RE | Admit: 2017-11-21 | Discharge: 2017-11-21 | Disposition: A | Discharge status: Home / Self Care | Payer: Managed Care, Other (non HMO) | Source: Ambulatory Visit | Attending: Obstetrics and Gynecology | Admitting: Obstetrics and Gynecology

## 2017-11-21 DIAGNOSIS — Z1231 Encounter for screening mammogram for malignant neoplasm of breast: Secondary | ICD-10-CM

## 2017-11-30 ENCOUNTER — Other Ambulatory Visit: Payer: Self-pay | Admitting: Family Medicine

## 2018-01-04 ENCOUNTER — Encounter (HOSPITAL_COMMUNITY): Payer: Self-pay

## 2018-01-05 ENCOUNTER — Other Ambulatory Visit: Payer: Self-pay | Admitting: Family Medicine

## 2018-01-05 NOTE — Telephone Encounter (Signed)
Ok to refill??  Last office visit 03/15/2017.  Last refill 06/08/2017, #2 refills.

## 2018-10-26 ENCOUNTER — Other Ambulatory Visit: Payer: Self-pay | Admitting: Family Medicine

## 2018-10-26 DIAGNOSIS — Z1231 Encounter for screening mammogram for malignant neoplasm of breast: Secondary | ICD-10-CM

## 2018-12-14 ENCOUNTER — Other Ambulatory Visit: Payer: Self-pay

## 2018-12-14 ENCOUNTER — Ambulatory Visit
Admission: RE | Admit: 2018-12-14 | Discharge: 2018-12-14 | Disposition: A | Discharge status: Home / Self Care | Payer: Managed Care, Other (non HMO) | Source: Ambulatory Visit | Attending: Family Medicine | Admitting: Family Medicine

## 2018-12-14 DIAGNOSIS — Z1231 Encounter for screening mammogram for malignant neoplasm of breast: Secondary | ICD-10-CM

## 2019-11-11 ENCOUNTER — Other Ambulatory Visit: Payer: Self-pay | Admitting: Family Medicine

## 2019-11-11 DIAGNOSIS — Z1231 Encounter for screening mammogram for malignant neoplasm of breast: Secondary | ICD-10-CM

## 2019-12-16 ENCOUNTER — Ambulatory Visit: Payer: Managed Care, Other (non HMO)

## 2019-12-19 ENCOUNTER — Other Ambulatory Visit: Payer: Self-pay

## 2019-12-19 ENCOUNTER — Ambulatory Visit
Admission: RE | Admit: 2019-12-19 | Discharge: 2019-12-19 | Disposition: A | Discharge status: Home / Self Care | Payer: Managed Care, Other (non HMO) | Source: Ambulatory Visit | Attending: Family Medicine | Admitting: Family Medicine

## 2019-12-19 DIAGNOSIS — Z1231 Encounter for screening mammogram for malignant neoplasm of breast: Secondary | ICD-10-CM

## 2020-12-03 ENCOUNTER — Other Ambulatory Visit: Payer: Self-pay | Admitting: Family Medicine

## 2020-12-03 DIAGNOSIS — Z1231 Encounter for screening mammogram for malignant neoplasm of breast: Secondary | ICD-10-CM

## 2021-01-05 ENCOUNTER — Ambulatory Visit
Admission: RE | Admit: 2021-01-05 | Discharge: 2021-01-05 | Disposition: A | Discharge status: Home / Self Care | Payer: Managed Care, Other (non HMO) | Source: Ambulatory Visit | Attending: Family Medicine | Admitting: Family Medicine

## 2021-01-05 ENCOUNTER — Other Ambulatory Visit: Payer: Self-pay

## 2021-01-05 DIAGNOSIS — Z1231 Encounter for screening mammogram for malignant neoplasm of breast: Secondary | ICD-10-CM

## 2021-11-23 ENCOUNTER — Other Ambulatory Visit: Payer: Self-pay | Admitting: Family Medicine

## 2021-11-23 DIAGNOSIS — Z1231 Encounter for screening mammogram for malignant neoplasm of breast: Secondary | ICD-10-CM

## 2022-01-06 ENCOUNTER — Ambulatory Visit: Payer: Managed Care, Other (non HMO)

## 2022-01-07 ENCOUNTER — Ambulatory Visit
Admission: RE | Admit: 2022-01-07 | Discharge: 2022-01-07 | Disposition: A | Discharge status: Home / Self Care | Payer: Managed Care, Other (non HMO) | Source: Ambulatory Visit | Attending: Family Medicine | Admitting: Family Medicine

## 2022-01-07 DIAGNOSIS — Z1231 Encounter for screening mammogram for malignant neoplasm of breast: Secondary | ICD-10-CM

## 2023-01-24 ENCOUNTER — Other Ambulatory Visit: Payer: Self-pay | Admitting: Family Medicine

## 2023-01-24 DIAGNOSIS — Z1231 Encounter for screening mammogram for malignant neoplasm of breast: Secondary | ICD-10-CM

## 2023-01-25 ENCOUNTER — Inpatient Hospital Stay
Admission: RE | Admit: 2023-01-25 | Discharge: 2023-01-25 | Discharge status: Home / Self Care | Payer: Managed Care, Other (non HMO) | Source: Ambulatory Visit | Attending: Family Medicine | Admitting: Family Medicine

## 2023-01-25 DIAGNOSIS — Z1231 Encounter for screening mammogram for malignant neoplasm of breast: Secondary | ICD-10-CM

## 2023-12-18 ENCOUNTER — Other Ambulatory Visit: Payer: Self-pay | Admitting: Family Medicine

## 2023-12-18 DIAGNOSIS — Z1231 Encounter for screening mammogram for malignant neoplasm of breast: Secondary | ICD-10-CM

## 2024-01-26 ENCOUNTER — Ambulatory Visit
Admission: RE | Admit: 2024-01-26 | Discharge: 2024-01-26 | Disposition: A | Source: Ambulatory Visit | Attending: Family Medicine | Admitting: Family Medicine

## 2024-01-26 DIAGNOSIS — Z1231 Encounter for screening mammogram for malignant neoplasm of breast: Secondary | ICD-10-CM
